# Patient Record
Sex: Male | Born: 1955 | Race: White | Hispanic: No | Marital: Married | Smoking: Never smoker
Health system: Southern US, Community
[De-identification: ages and names within clinical notes are randomized; demographics above are authoritative.]

## PROBLEM LIST (undated history)

## (undated) DIAGNOSIS — E7800 Pure hypercholesterolemia, unspecified: Secondary | ICD-10-CM

## (undated) DIAGNOSIS — R0789 Other chest pain: Secondary | ICD-10-CM

## (undated) DIAGNOSIS — M545 Low back pain, unspecified: Secondary | ICD-10-CM

## (undated) DIAGNOSIS — K4090 Unilateral inguinal hernia, without obstruction or gangrene, not specified as recurrent: Secondary | ICD-10-CM

## (undated) DIAGNOSIS — R7301 Impaired fasting glucose: Secondary | ICD-10-CM

## (undated) DIAGNOSIS — N200 Calculus of kidney: Secondary | ICD-10-CM

## (undated) DIAGNOSIS — Z8669 Personal history of other diseases of the nervous system and sense organs: Secondary | ICD-10-CM

## (undated) DIAGNOSIS — U071 COVID-19: Secondary | ICD-10-CM

## (undated) DIAGNOSIS — Z1211 Encounter for screening for malignant neoplasm of colon: Secondary | ICD-10-CM

## (undated) DIAGNOSIS — N486 Induration penis plastica: Secondary | ICD-10-CM

## (undated) DIAGNOSIS — I10 Essential (primary) hypertension: Secondary | ICD-10-CM

## (undated) HISTORY — DX: Pure hypercholesterolemia, unspecified: E78.00

## (undated) HISTORY — DX: Encounter for screening for malignant neoplasm of colon: Z12.11

## (undated) HISTORY — DX: Other chest pain: R07.89

## (undated) HISTORY — DX: Essential (primary) hypertension: I10

## (undated) HISTORY — PX: INGUINAL HERNIA REPAIR: SHX194

## (undated) HISTORY — DX: Impaired fasting glucose: R73.01

## (undated) HISTORY — DX: Calculus of kidney: N20.0

## (undated) HISTORY — DX: Unilateral inguinal hernia, without obstruction or gangrene, not specified as recurrent: K40.90

## (undated) HISTORY — DX: Personal history of other diseases of the nervous system and sense organs: Z86.69

## (undated) HISTORY — DX: COVID-19: U07.1

## (undated) HISTORY — DX: Low back pain, unspecified: M54.50

## (undated) HISTORY — DX: Low back pain: M54.5

## (undated) HISTORY — DX: Induration penis plastica: N48.6

## (undated) HISTORY — PX: LITHOTRIPSY: SUR834

---

## 1992-05-03 HISTORY — PX: LUMBAR DISC SURGERY: SHX700

## 2000-08-27 ENCOUNTER — Emergency Department (HOSPITAL_COMMUNITY): Admission: EM | Admit: 2000-08-27 | Discharge: 2000-08-27 | Payer: Self-pay | Admitting: Emergency Medicine

## 2007-11-20 ENCOUNTER — Ambulatory Visit (HOSPITAL_COMMUNITY): Admission: RE | Admit: 2007-11-20 | Discharge: 2007-11-20 | Payer: Self-pay | Admitting: Urology

## 2011-05-04 DIAGNOSIS — N486 Induration penis plastica: Secondary | ICD-10-CM

## 2011-05-04 HISTORY — DX: Induration penis plastica: N48.6

## 2011-09-28 ENCOUNTER — Encounter: Payer: Self-pay | Admitting: Family Medicine

## 2011-09-28 ENCOUNTER — Ambulatory Visit (INDEPENDENT_AMBULATORY_CARE_PROVIDER_SITE_OTHER): Payer: 59 | Admitting: Family Medicine

## 2011-09-28 VITALS — BP 126/83 | HR 66 | Temp 98.8°F | Wt 208.0 lb

## 2011-09-28 DIAGNOSIS — J019 Acute sinusitis, unspecified: Secondary | ICD-10-CM

## 2011-09-28 DIAGNOSIS — H6690 Otitis media, unspecified, unspecified ear: Secondary | ICD-10-CM

## 2011-09-28 DIAGNOSIS — I10 Essential (primary) hypertension: Secondary | ICD-10-CM

## 2011-09-28 MED ORDER — BENZONATATE 200 MG PO CAPS: 200.0000 mg | ORAL_CAPSULE | Freq: Two times a day (BID) | ORAL | Status: AC | PRN

## 2011-09-28 MED ORDER — AMOXICILLIN-POT CLAVULANATE 875-125 MG PO TABS: 1.0000 | ORAL_TABLET | Freq: Two times a day (BID) | ORAL | Status: AC

## 2011-09-28 NOTE — Assessment & Plan Note (Signed)
With acute sinisitis. Augmentin 875mg  bid x 10d. Tessalon perles 200mg  q8h prn.

## 2011-09-28 NOTE — Assessment & Plan Note (Signed)
Reports hx of good control on current med although monitoring has been sporadic at best and he mainly is basing this on how he feels. Continue current meds, obtain old records, needs routine HTN f/u q75mo.

## 2011-09-28 NOTE — Progress Notes (Signed)
Office Note 09/28/2011  CC:  Chief Complaint  Patient presents with  . Establish Care    cough, congestion, ear pressure x 1 week   Here to estab care. Former MD: Dr. Milford Cage in [place]. Switched here b/c wife comes here.  Pt not very communicative at all today and basically wanted to discuss present illness and not dwell on much further.    HPI:  Christopher Hunter is a 56 y.o. White male who is here to establish and discuss recent URI. Patient's most recent primary MD: Pt presents complaining of respiratory symptoms for 8+ days.  Primary symptoms are: nasal congestion, worsening facial/head fullness/PND, throat feeling that makes him want to cough, R>L ear ache.  ST initially but this part is better.  Worst symptoms seems to be the head pressure and cough.  Lately the symptoms seem to be worsening.  No fever. Pertinent negatives: No fevers, no wheezing, and no SOB.  No pain in face or teeth.  Symptoms made worse by night-time/sleeping.  Symptoms improved by nothing. Smoker? no Recent sick contact? unknown Muscle or joint aches? Run down/fatigue slightly but no achiness.   Additional ROS: no n/v/d or abdominal pain.  No rash.  No neck stiffness.   +Mild fatigue.  +Mild appetite loss.  Past Medical History  Diagnosis Date  . HTN (hypertension) dx'd about 2008  . Nephrolithiasis     No active dz. (Dr. Vernie Ammons)    Past Surgical History  Procedure Date  . Lithotripsy around 2009  . Lumbar disc surgery 1994    L5/S1 Dr. Lonny Prude  . Inguinal hernia repair     right   FH: pt not interested in discussing this today; he was preoccupied with his current illness and wanted to talk about it and so we'll save FH collection for next visit and get some through old records.  History   Social History  . Marital Status: Married    Spouse Name: N/A    Number of Children: N/A  . Years of Education: N/A   Occupational History  . Not on file.   Social History Main Topics  . Smoking  status: Never Smoker   . Smokeless tobacco: Never Used  . Alcohol Use: No  . Drug Use: No  . Sexually Active: Not on file   Other Topics Concern  . Not on file   Social History Narrative   Married, one 62 yr old son.Lives in [place], [place].  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    Outpatient Encounter Prescriptions as of 09/28/2011  Medication Sig Dispense Refill  . amoxicillin-clavulanate (AUGMENTIN) 875-125 MG per tablet Take 1 tablet by mouth 2 (two) times daily.  20 tablet  0  . benzonatate (TESSALON) 200 MG capsule Take 1 capsule (200 mg total) by mouth 2 (two) times daily as needed for cough.  20 capsule  1  . enalapril-hydrochlorothiazide (VASERETIC) 10-25 MG per tablet Take 1 tablet by mouth daily.      Marland Kitchen ibuprofen (ADVIL,MOTRIN) 800 MG tablet Take 800 mg by mouth every 6 (six) hours as needed.      . Multiple Vitamin (MULTIVITAMIN) tablet Take 1 tablet by mouth daily.        No Known Allergies  ROS Review of Systems  Constitutional: Negative for fever and fatigue.  HENT:       See HPI   Eyes: Negative for visual disturbance.  Respiratory: Negative for chest tightness and wheezing.   Cardiovascular: Negative  for chest pain and palpitations.  Gastrointestinal: Negative for nausea and abdominal pain.  Genitourinary: Negative for dysuria.  Musculoskeletal: Negative for back pain and joint swelling.  Skin: Negative for rash.  Neurological: Negative for weakness and headaches.  Hematological: Negative for adenopathy.    PE; Blood pressure 126/83, pulse 66, temperature 98.8 F (37.1 C), temperature source Temporal, weight 208 lb (94.348 kg), SpO2 99.00%. Gen: Alert, well appearing.  Patient is oriented to person, place, time, and situation. ENT: Ears: EACs clear, normal epithelium.  Left TM normal.  Right Tm with mild erythema and pus noted in middle ear.  Eyes: no injection, icteris, swelling, or exudate.  EOMI,  PERRLA. Nose: clear drainage and turbinate edema/swelling.  No injection or focal lesion.  Mouth: lips without lesion/swelling.  No TTP in paranasal sinus regions.  Oral mucosa pink and moist.  Dentition intact and without obvious caries or gingival swelling.  Oropharynx without erythema, exudate, or swelling.  Neck - No masses or thyromegaly or limitation in range of motion CV: RRR, no m/r/g.   LUNGS: CTA bilat, nonlabored resps, good aeration in all lung fields. JXB:JYNW, NT/ND EXT: no clubbing, cyanosis, or edema.   Pertinent labs:  None today  ASSESSMENT AND PLAN:   NEW PT: obtain old records.  Otitis media With acute sinisitis. Augmentin 875mg  bid x 10d. Tessalon perles 200mg  q8h prn.   HTN (hypertension), benign Reports hx of good control on current med although monitoring has been sporadic at best and he mainly is basing this on how he feels. Continue current meds, obtain old records, needs routine HTN f/u q19mo.     Return in about 6 months (around 03/30/2012) for routine HTN follow up.

## 2011-09-29 ENCOUNTER — Encounter: Payer: Self-pay | Admitting: Family Medicine

## 2011-09-29 DIAGNOSIS — Z1211 Encounter for screening for malignant neoplasm of colon: Secondary | ICD-10-CM | POA: Insufficient documentation

## 2012-02-15 ENCOUNTER — Other Ambulatory Visit: Payer: Self-pay | Admitting: *Deleted

## 2012-02-15 MED ORDER — ENALAPRIL-HYDROCHLOROTHIAZIDE 10-25 MG PO TABS: 1.0000 | ORAL_TABLET | Freq: Every day | ORAL | Status: DC

## 2012-02-15 NOTE — Telephone Encounter (Signed)
Faxed refill request received from pharmacy for ENALAPRIL-HCTZ Last filled by MD on never filled by our office Last seen on 09/28/11 Follow up 03/31/12 RX sent.

## 2012-02-16 ENCOUNTER — Other Ambulatory Visit: Payer: Self-pay | Admitting: *Deleted

## 2012-02-16 MED ORDER — IBUPROFEN 800 MG PO TABS: 800.0000 mg | ORAL_TABLET | Freq: Four times a day (QID) | ORAL | Status: DC | PRN

## 2012-02-16 NOTE — Telephone Encounter (Signed)
Faxed refill request received from pharmacy for Never filled by our office Last seen on 09/28/11  Follow up 03/31/12 Please advise refill.

## 2012-02-16 NOTE — Telephone Encounter (Signed)
RF done. Keep f/u appt in November.-thx

## 2012-03-31 ENCOUNTER — Encounter: Payer: Self-pay | Admitting: Family Medicine

## 2012-03-31 ENCOUNTER — Ambulatory Visit (INDEPENDENT_AMBULATORY_CARE_PROVIDER_SITE_OTHER): Payer: 59 | Admitting: Family Medicine

## 2012-03-31 VITALS — BP 121/78 | HR 91 | Ht 70.0 in | Wt 211.0 lb

## 2012-03-31 DIAGNOSIS — J069 Acute upper respiratory infection, unspecified: Secondary | ICD-10-CM

## 2012-03-31 DIAGNOSIS — I10 Essential (primary) hypertension: Secondary | ICD-10-CM

## 2012-03-31 DIAGNOSIS — Z1211 Encounter for screening for malignant neoplasm of colon: Secondary | ICD-10-CM

## 2012-03-31 LAB — BASIC METABOLIC PANEL
BUN: 17 mg/dL (ref 6–23)
GFR: 75.44 mL/min (ref >60.00)
Potassium: 3.2 mEq/L — ABNORMAL LOW (ref 3.5–5.1)
Sodium: 141 mEq/L (ref 135–145)

## 2012-03-31 NOTE — Progress Notes (Signed)
OFFICE NOTE  03/31/2012  CC:  Chief Complaint  Patient presents with  . Follow-up    HTN, also states he has a cold     HPI: Patient is a 56 y.o. Caucasian male who is here for 6 month f/u HTN. Was recently out of work 3 weeks due to a neck strain and this has resolved.   He does not monitor his bp at home.  ROS for this problem: no HAs, no vision complaints, no dizziness, no CP, no SOB, no extremity edema. Has had 3d of runny nose, scratchy throat, cough.  No fever.  No body aches.  Nyquil cold/flu and a cough drop helped last night.  Takes generic zyrtec once daily.  NO Chest tightness or wheezing.  Took a mucinex DM each night.     Pertinent PMH:  Past Medical History  Diagnosis Date  . HTN (hypertension) dx'd about 2008  . Nephrolithiasis     No active dz. (Dr. Vernie Ammons)  . 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 History of migraine headaches     MEDS:  Outpatient Prescriptions Prior to Visit  Medication Sig Dispense Refill  . enalapril-hydrochlorothiazide (VASERETIC) 10-25 MG per tablet Take 1 tablet by mouth daily.  30 tablet  5  . ibuprofen (ADVIL,MOTRIN) 800 MG tablet Take 1 tablet (800 mg total) by mouth every 6 (six) hours as needed.  30 tablet  1  . Multiple Vitamin (MULTIVITAMIN) tablet Take 1 tablet by mouth daily.        PE: Blood pressure 121/78, pulse 91, height 5\' 10"  (1.778 m), weight 211 lb (95.709 kg). VS: noted--normal. Gen: alert, NAD, well-APPEARING. HEENT: eyes without injection, drainage, or swelling.  Ears: EACs clear, TMs with normal light reflex and landmarks.  Nose: Clear rhinorrhea, with some dried, crusty exudate adherent to mildly injected mucosa.  No purulent d/c.  No paranasal sinus TTP.  No facial swelling.  Throat and mouth without focal lesion.  No pharyngial swelling, erythema, or exudate.   Neck: supple, no LAD.   LUNGS: CTA bilat, nonlabored resps.   CV: RRR, no m/r/g. EXT: no c/c/e SKIN: no rash  LAB:  none  IMPRESSION AND PLAN:  HTN (hypertension), benign Problem stable.  Continue current medications and diet appropriate for this condition.  We have reviewed our general long term plan for this problem and also reviewed symptoms and signs that should prompt the patient to call or return to the office. Check cr/lytes today.  Viral URI Self-limited nature of this illness was discussed, questions answered.  Discussed symptomatic care, rest, fluids.   Warning signs/symptoms of worsening illness were discussed.  Patient instructed to call or return if any of these occur.   Colon cancer screening Per old records, pt has repeatedly refused referral for this screening procedure.  At one point he agreed to finally get referral but so far he has kept putting the appt off/cancelling.  He said he would agree next year to a new referral to [facility] when I see him in April for his annual CPE. Of note, he said he did not care to have any of the routine blood screening that we usually get at adult annual CPE's and I said this was fine.   An After Visit Summary was printed and given to the patient.  FOLLOW UP: 37mo--CPE

## 2012-03-31 NOTE — Assessment & Plan Note (Signed)
Per old records, pt has repeatedly refused referral for this screening procedure.  At one point he agreed to finally get referral but so far he has kept putting the appt off/cancelling.  He said he would agree next year to a new referral to [facility] when I see him in April for his annual CPE. Of note, he said he did not care to have any of the routine blood screening that we usually get at adult annual CPE's and I said this was fine.

## 2012-03-31 NOTE — Assessment & Plan Note (Signed)
Problem stable.  Continue current medications and diet appropriate for this condition.  We have reviewed our general long term plan for this problem and also reviewed symptoms and signs that should prompt the patient to call or return to the office. Check cr/lytes today. 

## 2012-03-31 NOTE — Assessment & Plan Note (Signed)
Self-limited nature of this illness was discussed, questions answered.  Discussed symptomatic care, rest, fluids.   Warning signs/symptoms of worsening illness were discussed.  Patient instructed to call or return if any of these occur.  

## 2012-04-21 ENCOUNTER — Other Ambulatory Visit: Payer: Self-pay | Admitting: Family Medicine

## 2012-04-21 MED ORDER — POTASSIUM CHLORIDE CRYS ER 20 MEQ PO TBCR: 40.0000 meq | EXTENDED_RELEASE_TABLET | Freq: Every day | ORAL | Status: DC

## 2012-04-21 NOTE — Telephone Encounter (Signed)
Notes Recorded by Jeoffrey Massed, MD on 03/31/2012 at 4:06 PM Pls notify pt that his potassium was a bit low today and I recommend he take some rx potassium supplement as long as he is taking his current bp med. Please do eRx for KDUR 40 mEQ, 1 tab po qd, #30, RF x 6.--thx  RX was not sent at that time.  RX sent today.

## 2012-08-04 ENCOUNTER — Other Ambulatory Visit: Payer: Self-pay | Admitting: *Deleted

## 2012-08-04 MED ORDER — ENALAPRIL-HYDROCHLOROTHIAZIDE 10-25 MG PO TABS: 1.0000 | ORAL_TABLET | Freq: Every day | ORAL | Status: DC

## 2012-08-04 NOTE — Telephone Encounter (Signed)
Faxed refill request received from pharmacy for VASERETIC Last filled by MD on 02/15/12, #30 X 5 Last seen on 03/31/12 Follow up 6 MONTHS RX SENT.

## 2012-08-15 ENCOUNTER — Telehealth: Payer: Self-pay | Admitting: *Deleted

## 2012-08-15 MED ORDER — IBUPROFEN 800 MG PO TABS: 800.0000 mg | ORAL_TABLET | Freq: Four times a day (QID) | ORAL | Status: DC | PRN

## 2012-08-15 NOTE — Telephone Encounter (Signed)
Rx sent in to pharmacy. 

## 2012-08-31 ENCOUNTER — Encounter: Payer: Self-pay | Admitting: Family Medicine

## 2012-08-31 ENCOUNTER — Ambulatory Visit (INDEPENDENT_AMBULATORY_CARE_PROVIDER_SITE_OTHER): Payer: BC Managed Care – PPO | Admitting: Family Medicine

## 2012-08-31 VITALS — BP 137/87 | HR 66 | Temp 98.1°F | Resp 16 | Ht 69.5 in | Wt 209.0 lb

## 2012-08-31 DIAGNOSIS — F17200 Nicotine dependence, unspecified, uncomplicated: Secondary | ICD-10-CM

## 2012-08-31 DIAGNOSIS — Z720 Tobacco use: Secondary | ICD-10-CM | POA: Insufficient documentation

## 2012-08-31 DIAGNOSIS — Z1211 Encounter for screening for malignant neoplasm of colon: Secondary | ICD-10-CM

## 2012-08-31 DIAGNOSIS — Z0000 Encounter for general adult medical examination without abnormal findings: Secondary | ICD-10-CM | POA: Insufficient documentation

## 2012-08-31 DIAGNOSIS — Z125 Encounter for screening for malignant neoplasm of prostate: Secondary | ICD-10-CM

## 2012-08-31 LAB — CBC WITH DIFFERENTIAL/PLATELET
Eosinophils Relative: 1.2 % (ref 0.0–5.0)
HCT: 44.7 % (ref 39.0–52.0)
Lymphs Abs: 1.3 10*3/uL (ref 0.7–4.0)
Monocytes Relative: 9 % (ref 3.0–12.0)
Platelets: 140 10*3/uL — ABNORMAL LOW (ref 150.0–400.0)
WBC: 5.4 10*3/uL (ref 4.5–10.5)

## 2012-08-31 LAB — TSH: TSH: 0.77 u[IU]/mL (ref 0.35–5.50)

## 2012-08-31 LAB — LIPID PANEL
Cholesterol: 151 mg/dL (ref 0–200)
HDL: 42.5 mg/dL (ref >39.00)
VLDL: 19.2 mg/dL (ref 0.0–40.0)

## 2012-08-31 LAB — COMPREHENSIVE METABOLIC PANEL
Alkaline Phosphatase: 48 U/L (ref 39–117)
BUN: 17 mg/dL (ref 6–23)
Creatinine, Ser: 1.1 mg/dL (ref 0.4–1.5)
Glucose, Bld: 90 mg/dL (ref 70–99)
Sodium: 136 mEq/L (ref 135–145)
Total Bilirubin: 0.7 mg/dL (ref 0.3–1.2)

## 2012-08-31 LAB — PSA: PSA: 0.94 ng/mL (ref 0.10–4.00)

## 2012-08-31 MED ORDER — NICOTINE 14 MG/24HR TD PT24: 1.0000 | MEDICATED_PATCH | TRANSDERMAL | Status: DC

## 2012-08-31 MED ORDER — NICOTINE 7 MG/24HR TD PT24: 1.0000 | MEDICATED_PATCH | TRANSDERMAL | Status: DC

## 2012-08-31 MED ORDER — NICOTINE POLACRILEX 2 MG MT GUM: 2.0000 mg | CHEWING_GUM | OROMUCOSAL | Status: DC | PRN

## 2012-08-31 NOTE — Patient Instructions (Signed)
Health Maintenance, Males A healthy lifestyle and preventative care can promote health and wellness.  Maintain regular health, dental, and eye exams.  Eat a healthy diet. Foods like vegetables, fruits, whole grains, low-fat dairy products, and lean protein foods contain the nutrients you need without too many calories. Decrease your intake of foods high in solid fats, added sugars, and salt. Get information about a proper diet from your caregiver, if necessary.  Regular physical exercise is one of the most important things you can do for your health. Most adults should get at least 150 minutes of moderate-intensity exercise (any activity that increases your heart rate and causes you to sweat) each week. In addition, most adults need muscle-strengthening exercises on 2 or more days a week.   Maintain a healthy weight. The body mass index (BMI) is a screening tool to identify possible weight problems. It provides an estimate of body fat based on height and weight. Your caregiver can help determine your BMI, and can help you achieve or maintain a healthy weight. For adults 20 years and older:  A BMI below 18.5 is considered underweight.  A BMI of 18.5 to 24.9 is normal.  A BMI of 25 to 29.9 is considered overweight.  A BMI of 30 and above is considered obese.  Maintain normal blood lipids and cholesterol by exercising and minimizing your intake of saturated fat. Eat a balanced diet with plenty of fruits and vegetables. Blood tests for lipids and cholesterol should begin at age 20 and be repeated every 5 years. If your lipid or cholesterol levels are high, you are over 50, or you are a high risk for heart disease, you may need your cholesterol levels checked more frequently.Ongoing high lipid and cholesterol levels should be treated with medicines, if diet and exercise are not effective.  If you smoke, find out from your caregiver how to quit. If you do not use tobacco, do not start.  If you  choose to drink alcohol, do not exceed 2 drinks per day. One drink is considered to be 12 ounces (355 mL) of beer, 5 ounces (148 mL) of wine, or 1.5 ounces (44 mL) of liquor.  Avoid use of street drugs. Do not share needles with anyone. Ask for help if you need support or instructions about stopping the use of drugs.  High blood pressure causes heart disease and increases the risk of stroke. Blood pressure should be checked at least every 1 to 2 years. Ongoing high blood pressure should be treated with medicines if weight loss and exercise are not effective.  If you are 45 to 56 years old, ask your caregiver if you should take aspirin to prevent heart disease.  Diabetes screening involves taking a blood sample to check your fasting blood sugar level. This should be done once every 3 years, after age 45, if you are within normal weight and without risk factors for diabetes. Testing should be considered at a younger age or be carried out more frequently if you are overweight and have at least 1 risk factor for diabetes.  Colorectal cancer can be detected and often prevented. Most routine colorectal cancer screening begins at the age of 50 and continues through age 75. However, your caregiver may recommend screening at an earlier age if you have risk factors for colon cancer. On a yearly basis, your caregiver may provide home test kits to check for hidden blood in the stool. Use of a small camera at the end of a tube,   to directly examine the colon (sigmoidoscopy or colonoscopy), can detect the earliest forms of colorectal cancer. Talk to your caregiver about this at age 50, when routine screening begins. Direct examination of the colon should be repeated every 5 to 10 years through age 75, unless early forms of pre-cancerous polyps or small growths are found.  Hepatitis C blood testing is recommended for all people born from 1945 through 1965 and any individual with known risks for hepatitis C.  Healthy  men should no longer receive prostate-specific antigen (PSA) blood tests as part of routine cancer screening. Consult with your caregiver about prostate cancer screening.  Testicular cancer screening is not recommended for adolescents or adult males who have no symptoms. Screening includes self-exam, caregiver exam, and other screening tests. Consult with your caregiver about any symptoms you have or any concerns you have about testicular cancer.  Practice safe sex. Use condoms and avoid high-risk sexual practices to reduce the spread of sexually transmitted infections (STIs).  Use sunscreen with a sun protection factor (SPF) of 30 or greater. Apply sunscreen liberally and repeatedly throughout the day. You should seek shade when your shadow is shorter than you. Protect yourself by wearing long sleeves, pants, a wide-brimmed hat, and sunglasses year round, whenever you are outdoors.  Notify your caregiver of new moles or changes in moles, especially if there is a change in shape or color. Also notify your caregiver if a mole is larger than the size of a pencil eraser.  A one-time screening for abdominal aortic aneurysm (AAA) and surgical repair of large AAAs by sound wave imaging (ultrasonography) is recommended for ages 65 to 75 years who are current or former smokers.  Stay current with your immunizations. Document Released: 10/16/2007 Document Revised: 07/12/2011 Document Reviewed: 09/14/2010 Exit[employer] Patient Information 2013 [employer].  

## 2012-08-31 NOTE — Progress Notes (Addendum)
Office Note 08/31/2012  CC:  Chief Complaint  Patient presents with  . Annual Exam    Pt is fasting for labs.    HPI:  Christopher Hunter is a 57 y.o. White male who is here for CPE/health maintenance exam.   It turns out there was a misunderstanding last visit, and he does want to get the usual annual screening blood work with his physical, including PSA. He also has a note from his employer citing incentives at work if he quits using smokeless tobacco and he wants to quit this now. The form says that his insurer will cover cost of rx nicotine patches and gum.  He pretty much has a dip in all day except when eating.   He does not smoke cigarettes or cigars.   Past Medical History  Diagnosis Date  . HTN (hypertension) dx'd about 2008  . Nephrolithiasis     No active dz. (Dr. Vernie Ammons)  . 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 History of migraine headaches     Past Surgical History  Procedure Laterality Date  . Lithotripsy  around 2009  . Lumbar disc surgery  1994    L5/S1 Dr. Lonny Prude  . Inguinal hernia repair      right    No family history on file.  History   Social History  . Marital Status: Married    Spouse Name: N/A    Number of Children: N/A  . Years of Education: N/A   Occupational History  . Not on file.   Social History Main Topics  . Smoking status: Never Smoker   . Smokeless tobacco: Current User    Types: Snuff  . Alcohol Use: No  . Drug Use: No  . Sexually Active: Not on file   Other Topics Concern  . Not on file   Social History Narrative   Married, one 44 yr old son.   Lives in [place], [place].  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   Never smoker, no ETOH, no drugs.          Outpatient Prescriptions Prior to Visit  Medication Sig Dispense Refill  . enalapril-hydrochlorothiazide (VASERETIC) 10-25 MG per tablet Take 1 tablet by mouth daily.  30 tablet  1  . ibuprofen (ADVIL,MOTRIN) 800 MG tablet Take 1  tablet (800 mg total) by mouth every 6 (six) hours as needed.  30 tablet  0  . Multiple Vitamin (MULTIVITAMIN) tablet Take 1 tablet by mouth daily.      . potassium chloride SA (K-DUR,KLOR-CON) 20 MEQ tablet Take 2 tablets (40 mEq total) by mouth daily.  60 tablet  6   No facility-administered medications prior to visit.    No Known Allergies  ROS Review of Systems  Constitutional: Negative for fever, chills, appetite change and fatigue.  HENT: Negative for ear pain, congestion, sore throat, neck stiffness and dental problem.   Eyes: Negative for discharge, redness and visual disturbance.  Respiratory: Negative for cough, chest tightness, shortness of breath and wheezing.   Cardiovascular: Negative for chest pain, palpitations and leg swelling.  Gastrointestinal: Negative for nausea, vomiting, abdominal pain, diarrhea and blood in stool.  Genitourinary: Negative for dysuria, urgency, frequency, hematuria, flank pain and difficulty urinating.  Musculoskeletal: Negative for myalgias, back pain, joint swelling and arthralgias.  Skin: Negative for pallor and rash.  Neurological: Negative for dizziness, speech difficulty, weakness and headaches.  Hematological: Negative for adenopathy. Does not bruise/bleed easily.  Psychiatric/Behavioral: Negative  for confusion and sleep disturbance. The patient is not nervous/anxious.     PE; Blood pressure 137/87, pulse 66, temperature 98.1 F (36.7 C), temperature source Oral, resp. rate 16, height 5' 9.5" (1.765 m), weight 209 lb (94.802 kg), SpO2 99.00%. Gen: Alert, well appearing.  Patient is oriented to person, place, time, and situation. AFFECT: pleasant, lucid thought and speech. ENT: Ears: EACs clear, normal epithelium.  TMs with good light reflex and landmarks bilaterally.  Eyes: no injection, icteris, swelling, or exudate.  EOMI, PERRLA. Nose: no drainage or turbinate edema/swelling.  No injection or focal lesion.  Mouth: lips without  lesion/swelling.  Oral mucosa pink and moist.  Dentition intact and without obvious caries or gingival swelling.  Oropharynx without erythema, exudate, or swelling.  Neck: supple/nontender.  No LAD, mass, or TM.  Carotid pulses 2+ bilaterally, without bruits. CV: RRR, no m/r/g.   LUNGS: CTA bilat, nonlabored resps, good aeration in all lung fields. ABD: soft, NT, ND, BS normal.  No hepatospenomegaly or mass.  No bruits. EXT: no clubbing, cyanosis, or edema.  Musculoskeletal: no joint swelling, erythema, warmth, or tenderness.  ROM of all joints intact. Skin - no sores or suspicious lesions or rashes or color changes Genitals normal; both testes normal without tenderness, masses, hydroceles, varicoceles, erythema or swelling. Shaft normal, circumcised, meatus normal without discharge. No inguinal hernia noted. No inguinal lymphadenopathy. Rectal exam: negative without mass, lesions or tenderness, PROSTATE EXAM: smooth and symmetric without nodules or tenderness.   Pertinent labs:  None today  ASSESSMENT AND PLAN:   Health maintenance examination Reviewed age and gender appropriate health maintenance issues (prudent diet, regular exercise, health risks of tobacco and excessive alcohol, use of seatbelts, fire alarms in home, use of sunscreen).  Also reviewed age and gender appropriate health screening as well as vaccine recommendations. Routine health panel labs + PSA done today.  DRE normal today. Referred to [facility] for colon cancer screening today.  Smokeless tobacco use He is ready to attempt quitting but wants nicotine replacement rx's so I wrote rx's for nicoderm 14mg /day patches x 26mo, then 7 mg/day patches x 26mo. Also wrote rx for nicorette gum 2 mg, take prn cravings.  An After Visit Summary was printed and given to the patient.  FOLLOW UP:  Return in about 6 months (around 03/03/2013) for f/u HTN.

## 2012-08-31 NOTE — Assessment & Plan Note (Signed)
He is ready to attempt quitting but wants nicotine replacement rx's so I wrote rx's for nicoderm 14mg /day patches x 16mo, then 7 mg/day patches x 16mo. Also wrote rx for nicorette gum 2 mg, take prn cravings.

## 2012-08-31 NOTE — Assessment & Plan Note (Signed)
Reviewed age and gender appropriate health maintenance issues (prudent diet, regular exercise, health risks of tobacco and excessive alcohol, use of seatbelts, fire alarms in home, use of sunscreen).  Also reviewed age and gender appropriate health screening as well as vaccine recommendations. Routine health panel labs + PSA done today.  DRE normal today. Referred to [facility] for colon cancer screening today.

## 2012-09-01 ENCOUNTER — Encounter: Payer: Self-pay | Admitting: *Deleted

## 2012-09-28 ENCOUNTER — Telehealth: Payer: Self-pay | Admitting: Family Medicine

## 2012-09-28 MED ORDER — NICOTINE 14 MG/24HR TD PT24: 1.0000 | MEDICATED_PATCH | TRANSDERMAL | Status: DC

## 2012-09-28 NOTE — Telephone Encounter (Signed)
Patient would like one more refill of the 14 mg. He said quitting smoking has been harder than he thought it would be.

## 2012-09-28 NOTE — Telephone Encounter (Signed)
Rx sent 

## 2012-10-18 ENCOUNTER — Other Ambulatory Visit: Payer: Self-pay | Admitting: *Deleted

## 2012-10-18 MED ORDER — ENALAPRIL-HYDROCHLOROTHIAZIDE 10-25 MG PO TABS: 1.0000 | ORAL_TABLET | Freq: Every day | ORAL | Status: DC

## 2012-10-18 NOTE — Telephone Encounter (Signed)
Rx request to pharmacy/[facility]  

## 2012-10-20 ENCOUNTER — Other Ambulatory Visit: Payer: Self-pay | Admitting: Family Medicine

## 2012-10-20 MED ORDER — IBUPROFEN 800 MG PO TABS: 800.0000 mg | ORAL_TABLET | Freq: Four times a day (QID) | ORAL | Status: DC | PRN

## 2012-10-20 NOTE — Telephone Encounter (Signed)
Patient requesting ibuprofen 800mg .  Patient was last seen 08/31/12 and has an upcoming appt 03/26/13.  Refilled 30 pills no refill.

## 2012-11-01 ENCOUNTER — Encounter: Payer: Self-pay | Admitting: Gastroenterology

## 2012-11-08 ENCOUNTER — Encounter: Payer: Self-pay | Admitting: Family Medicine

## 2012-12-12 ENCOUNTER — Encounter: Payer: BC Managed Care – PPO | Admitting: Gastroenterology

## 2012-12-19 ENCOUNTER — Other Ambulatory Visit: Payer: Self-pay | Admitting: *Deleted

## 2012-12-19 NOTE — Telephone Encounter (Signed)
Refill request for ibuprofen Last filled by MD on - 10/20/12 Last Seen- 08/31/12 Next Appt- 03/26/13 Please advise refill?

## 2012-12-21 MED ORDER — IBUPROFEN 800 MG PO TABS: 800.0000 mg | ORAL_TABLET | Freq: Four times a day (QID) | ORAL | Status: DC | PRN

## 2013-01-23 ENCOUNTER — Other Ambulatory Visit: Payer: Self-pay | Admitting: Family Medicine

## 2013-01-23 MED ORDER — ENALAPRIL-HYDROCHLOROTHIAZIDE 10-25 MG PO TABS: 1.0000 | ORAL_TABLET | Freq: Every day | ORAL | Status: DC

## 2013-01-26 ENCOUNTER — Ambulatory Visit (INDEPENDENT_AMBULATORY_CARE_PROVIDER_SITE_OTHER): Payer: BC Managed Care – PPO | Admitting: Family Medicine

## 2013-01-26 ENCOUNTER — Encounter: Payer: Self-pay | Admitting: Family Medicine

## 2013-01-26 VITALS — BP 128/85 | HR 73 | Temp 98.4°F | Resp 18 | Ht 69.5 in | Wt 215.0 lb

## 2013-01-26 DIAGNOSIS — J019 Acute sinusitis, unspecified: Secondary | ICD-10-CM

## 2013-01-26 MED ORDER — FLUTICASONE PROPIONATE 50 MCG/ACT NA SUSP: NASAL | Status: DC

## 2013-01-26 MED ORDER — AMOXICILLIN 875 MG PO TABS: 875.0000 mg | ORAL_TABLET | Freq: Two times a day (BID) | ORAL | Status: DC

## 2013-01-26 NOTE — Progress Notes (Signed)
OFFICE NOTE  01/26/2013  CC:  Chief Complaint  Patient presents with  . Pressure Behind the Eyes    x Wednesday  . Headache     HPI: Patient is a 57 y.o. Caucasian male who is here for "sinus headache".  1 week of nasal congestion, left side got worse 2 d/a, feeling pressure behind eyes bilat throughout the day and then by evening he gets headache.  Nasal congestion is improved but he still feels a bit stopped up and has scratchy throat.  OTC meds: nyquil cold and flu every night (even without a cold) "to help stop my snoring".  Mucinex nose spray. He did run out of BP meds for two days but he didn't think it had anything to do with his so he didn't check his bp any.  He restarted bp med yesterday.  Not feeling any better yesterday.  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   Pertinent PMH:  Past Medical History  Diagnosis Date  . HTN (hypertension) dx'd about 2008  . Nephrolithiasis     No active dz. (Dr. Vernie Ammons)  . 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 History of migraine headaches     MEDS:  Outpatient Prescriptions Prior to Visit  Medication Sig Dispense Refill  . cetirizine (ZYRTEC) 10 MG tablet Take 10 mg by mouth daily.      . enalapril-hydrochlorothiazide (VASERETIC) 10-25 MG per tablet Take 1 tablet by mouth daily.  30 tablet  2  . ibuprofen (ADVIL,MOTRIN) 800 MG tablet Take 1 tablet (800 mg total) by mouth every 6 (six) hours as needed.  30 tablet  2  . Multiple Vitamin (MULTIVITAMIN) tablet Take 1 tablet by mouth daily.      . nicotine polacrilex (NICORETTE) 2 MG gum Take 1 each (2 mg total) by mouth as needed for smoking cessation.  50 tablet  2  . potassium chloride SA (K-DUR,KLOR-CON) 20 MEQ tablet Take 20 mEq by mouth daily.      . nicotine (NICODERM CQ - DOSED IN MG/24 HOURS) 14 mg/24hr patch Place 1 patch onto the skin daily.  28 patch  0  . nicotine (NICODERM CQ) 7 mg/24hr patch Place 1 patch onto the skin daily.  28 patch  0   No  facility-administered medications prior to visit.    PE: Blood pressure 128/85, pulse 73, temperature 98.4 F (36.9 C), temperature source Temporal, resp. rate 18, height 5' 9.5" (1.765 m), weight 215 lb (97.523 kg), SpO2 98.00%. VS: noted--normal. Gen: alert, NAD, NONTOXIC APPEARING. HEENT: eyes without injection, drainage, or swelling.  Ears: EACs clear, TMs with normal light reflex and landmarks.  Nose: Clear rhinorrhea, with some dried, crusty exudate adherent to mildly injected mucosa.  No purulent d/c.  No paranasal sinus TTP but PRESSURE with palpation.  No facial swelling.  Throat and mouth without focal lesion.  No pharyngial swelling, erythema, or exudate but some dingy PND is noted.   Neck: supple, no LAD.   LUNGS: CTA bilat, nonlabored resps.   CV: RRR, no m/r/g. EXT: no c/c/e     IMPRESSION AND PLAN:  Acute sinusitis: Amoxil 875mg  bid x 10d. Take tylenol 1000 mg morning, then again 6-8 hours later if still having a headache. You may still take your nyquil cold and flu at bedtime.  Restart nasal saline spray (OTC) 2 sprays each nostril twice a day to irrigate and clear your nasal passages--this will help relieve some of the head pressure you feel.  An After Visit Summary was printed and given to the patient.  FOLLOW UP: prn

## 2013-01-26 NOTE — Patient Instructions (Addendum)
Take tylenol 1000 mg morning, then again 6-8 hours later if still having a headache. You may still take your nyquil cold and flu at bedtime.  Restart nasal saline spray (OTC) 2 sprays each nostril twice a day to irrigate and clear your nasal passages--this will help relieve some of the head pressure you feel.

## 2013-02-26 ENCOUNTER — Telehealth: Payer: Self-pay | Admitting: Family Medicine

## 2013-02-26 MED ORDER — AMOXICILLIN 875 MG PO TABS: 875.0000 mg | ORAL_TABLET | Freq: Two times a day (BID) | ORAL | Status: DC

## 2013-02-26 NOTE — Telephone Encounter (Signed)
Patient is having the exact same sinus symptoms as he did on his last visit but patient now says it is in his Right side in stead of his left.  Can you rx amoxicillin again for patient to treat.   Patient states that he would have to take a day off of work if he needed an OV.  Please advise.

## 2013-02-26 NOTE — Telephone Encounter (Signed)
Amoxicillin eRx'd to his pharmacy. If not improved in 1 week then he needs to come in for o/v.---thx

## 2013-02-26 NOTE — Telephone Encounter (Signed)
Patient aware.

## 2013-03-26 ENCOUNTER — Ambulatory Visit (INDEPENDENT_AMBULATORY_CARE_PROVIDER_SITE_OTHER): Payer: BC Managed Care – PPO | Admitting: Family Medicine

## 2013-03-26 ENCOUNTER — Encounter: Payer: Self-pay | Admitting: Family Medicine

## 2013-03-26 VITALS — BP 134/87 | HR 73 | Temp 99.4°F | Resp 18 | Ht 69.5 in | Wt 219.0 lb

## 2013-03-26 DIAGNOSIS — F17200 Nicotine dependence, unspecified, uncomplicated: Secondary | ICD-10-CM

## 2013-03-26 DIAGNOSIS — Z720 Tobacco use: Secondary | ICD-10-CM

## 2013-03-26 DIAGNOSIS — I10 Essential (primary) hypertension: Secondary | ICD-10-CM

## 2013-03-26 MED ORDER — ASPIRIN 81 MG PO TABS: 81.0000 mg | ORAL_TABLET | Freq: Every day | ORAL | Status: DC

## 2013-03-26 MED ORDER — POTASSIUM CHLORIDE CRYS ER 20 MEQ PO TBCR: 20.0000 meq | EXTENDED_RELEASE_TABLET | Freq: Every day | ORAL | Status: DC

## 2013-03-26 MED ORDER — ENALAPRIL-HYDROCHLOROTHIAZIDE 10-25 MG PO TABS: 1.0000 | ORAL_TABLET | Freq: Every day | ORAL | Status: DC

## 2013-03-26 NOTE — Patient Instructions (Signed)
Start baby aspirin (81mg) once daily.

## 2013-03-26 NOTE — Assessment & Plan Note (Signed)
Successfully quit! Congrats to pt today.

## 2013-03-26 NOTE — Progress Notes (Signed)
OFFICE NOTE  03/26/2013  CC:  Chief Complaint  Patient presents with  . Follow-up    6 months     HPI: Patient is a 57 y.o. Caucasian male who is here for 6 mo HTN and dipping tob dependence f/u. He has quit dipping tobacco since I last saw him, still chewing some nicerette gum sometimes.   Pertinent PMH:  Past Medical History  Diagnosis Date  . HTN (hypertension) dx'd about 2008  . Nephrolithiasis     No active dz. (Dr. Vernie Ammons)  . 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 History of migraine headaches    Past surgical, social, and family history reviewed and no changes noted since last office visit.  MEDS:  Outpatient Prescriptions Prior to Visit  Medication Sig Dispense Refill  . cetirizine (ZYRTEC) 10 MG tablet Take 10 mg by mouth daily.      . enalapril-hydrochlorothiazide (VASERETIC) 10-25 MG per tablet Take 1 tablet by mouth daily.  30 tablet  2  . fluticasone (FLONASE) 50 MCG/ACT nasal spray 2 sprays each nostril once daily  16 g  1  . ibuprofen (ADVIL,MOTRIN) 800 MG tablet Take 1 tablet (800 mg total) by mouth every 6 (six) hours as needed.  30 tablet  2  . Multiple Vitamin (MULTIVITAMIN) tablet Take 1 tablet by mouth daily.      . nicotine polacrilex (NICORETTE) 2 MG gum Take 1 each (2 mg total) by mouth as needed for smoking cessation.  50 tablet  2  . potassium chloride SA (K-DUR,KLOR-CON) 20 MEQ tablet Take 20 mEq by mouth daily.      Marland Kitchen amoxicillin (AMOXIL) 875 MG tablet Take 1 tablet (875 mg total) by mouth 2 (two) times daily.  20 tablet  0  . nicotine (NICODERM CQ - DOSED IN MG/24 HOURS) 14 mg/24hr patch Place 1 patch onto the skin daily.  28 patch  0  . nicotine (NICODERM CQ) 7 mg/24hr patch Place 1 patch onto the skin daily.  28 patch  0   No facility-administered medications prior to visit.    PE: Blood pressure 134/87, pulse 73, temperature 99.4 F (37.4 C), temperature source Temporal, resp. rate 18, height 5' 9.5" (1.765 m),  weight 219 lb (99.338 kg), SpO2 96.00%. Gen: Alert, well appearing.  Patient is oriented to person, place, time, and situation. Neck: no carotid bruit CV: RRR, no m/r/g.   LUNGS: CTA bilat, nonlabored resps, good aeration in all lung fields. EXT: no clubbing, cyanosis, or edema.    IMPRESSION AND PLAN:  HTN (hypertension), benign Problem stable.  Continue current medications and diet appropriate for this condition.  We have reviewed our general long term plan for this problem and also reviewed symptoms and signs that should prompt the patient to call or return to the office. RFd med today.  Smokeless tobacco use Successfully quit! Congrats to pt today.   Pt declined flu vaccine today.  FOLLOW UP: 44mo for fasting CPE

## 2013-03-26 NOTE — Assessment & Plan Note (Signed)
Problem stable.  Continue current medications and diet appropriate for this condition.  We have reviewed our general long term plan for this problem and also reviewed symptoms and signs that should prompt the patient to call or return to the office. RFd med today.

## 2013-07-01 HISTORY — PX: CARDIOVASCULAR STRESS TEST: SHX262

## 2013-07-19 ENCOUNTER — Encounter: Payer: Self-pay | Admitting: Family Medicine

## 2013-07-19 ENCOUNTER — Telehealth: Payer: Self-pay | Admitting: Cardiology

## 2013-07-19 ENCOUNTER — Ambulatory Visit (INDEPENDENT_AMBULATORY_CARE_PROVIDER_SITE_OTHER): Payer: BC Managed Care – PPO | Admitting: Family Medicine

## 2013-07-19 VITALS — BP 148/87 | HR 72 | Temp 98.0°F | Resp 18 | Ht 69.5 in | Wt 217.0 lb

## 2013-07-19 DIAGNOSIS — R9431 Abnormal electrocardiogram [ECG] [EKG]: Secondary | ICD-10-CM

## 2013-07-19 DIAGNOSIS — R0789 Other chest pain: Secondary | ICD-10-CM

## 2013-07-19 DIAGNOSIS — R079 Chest pain, unspecified: Secondary | ICD-10-CM

## 2013-07-19 NOTE — Patient Instructions (Signed)
If not contacted by cardiology (Dr. Verta EllenVaronovsky's nurse) by early next week then call our office.

## 2013-07-19 NOTE — Telephone Encounter (Signed)
Dr. Milinda CaveMcgowen called and spoke with Dr. Eldridge DaceVaranasi regarding pt. Pt has an ABN EKG and Atypical CP. Per Dr. Eldridge DaceVaranasi pt should be set up for a Exercise Myoview.

## 2013-07-19 NOTE — Telephone Encounter (Addendum)
Please schedule pt for Exercise Myoview and send back to me to call pt with date/time and go over instructions.

## 2013-07-19 NOTE — Progress Notes (Signed)
OFFICE NOTE  07/19/2013  CC:  Chief Complaint  Patient presents with  . Chest Pain    on exertion, a couple weeks  . Fatigue     HPI: Patient is a 58 y.o. Caucasian male who is here for chest pain. Reports recurrent chest pain after exertion; sharp, shock-like twinge w/out pressure, and just for 1-2 seconds, associated with diaphoresis and "fatigue--very drained".  Felt some nausea 20-30 min later, had some lightheaded feeling.  No SOB, no cough, no palpitations, no jaw or arm pain. Has had 2 distinct episodes about 2 weeks apart (once was cutting wood, then yesterday was lots of moving heavy objects). Over the last year he has noted that he has easier fatigue and "give out" with activities that never would have made him have problems before. BP at home wnl as per usual lately. No persistent ingestion of NSAIDs lately. Taking his ASA qd.  Pertinent PMH:  Past Medical History  Diagnosis Date  . HTN (hypertension) dx'd about 2008  . Nephrolithiasis     No active dz. (Dr. Vernie Ammonsttelin)  . 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 History of migraine headaches    Past Surgical History  Procedure Laterality Date  . Lithotripsy  around 2009  . Lumbar disc surgery  1994    L5/S1 Dr. Lonny Prudeoby  . Inguinal hernia repair      right   History   Social History Narrative   Married, one 57 yr old son.   Lives in [place], [place].  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   Never smoker, no ETOH, no drugs.          MEDS:  Outpatient Prescriptions Prior to Visit  Medication Sig Dispense Refill  . aspirin 81 MG tablet Take 1 tablet (81 mg total) by mouth daily.  30 tablet  11  . cetirizine (ZYRTEC) 10 MG tablet Take 10 mg by mouth daily.      . enalapril-hydrochlorothiazide (VASERETIC) 10-25 MG per tablet Take 1 tablet by mouth daily.  30 tablet  11  . fluticasone (FLONASE) 50 MCG/ACT nasal spray 2 sprays each nostril once daily  16 g  1  . ibuprofen  (ADVIL,MOTRIN) 800 MG tablet Take 1 tablet (800 mg total) by mouth every 6 (six) hours as needed.  30 tablet  2  . Multiple Vitamin (MULTIVITAMIN) tablet Take 1 tablet by mouth daily.      . nicotine polacrilex (NICORETTE) 2 MG gum Take 1 each (2 mg total) by mouth as needed for smoking cessation.  50 tablet  2  . potassium chloride SA (K-DUR,KLOR-CON) 20 MEQ tablet Take 1 tablet (20 mEq total) by mouth daily.  30 tablet  11   No facility-administered medications prior to visit.    PE: Blood pressure 148/87, pulse 72, temperature 98 F (36.7 C), temperature source Temporal, resp. rate 18, height 5' 9.5" (1.765 m), weight 217 lb (98.431 kg), SpO2 98.00%. Gen: Alert, well appearing.  Patient is oriented to person, place, time, and situation. MVH:QIONENT:Eyes: no injection, icteris, swelling, or exudate.  EOMI, PERRLA. Mouth: lips without lesion/swelling.  Oral mucosa pink and moist. Oropharynx without erythema, exudate, or swelling.  Neck - No masses or thyromegaly or limitation in range of motion CV: RRR, no m/r/g.   LUNGS: CTA bilat, nonlabored resps, good aeration in all lung fields. ABD: soft, NT EXT: no clubbing, cyanosis, or edema.    12 lead EKG today: (no prior avail for comparison)--NSR,  rate 72, possible anterior fascicular block, inverted T wave and low voltage in V1.  IMPRESSION AND PLAN:  Atypical chest pain Overall, his presentation is concerning for angina. No sign of ACS. Reviewed pt's case + EKG today with cardiologist, Dr. Raelene Bott, by phone. He recommended nonurgent stress testing and said his nurse would contact the patient to set this up. I wrote a work note for the patient today to stay out of work starting today, may return after stress test complete and if no further testing/intervention needed to r/o CAD. He said there is no "light duty" option with his employer.  To be determined based on results of pending workup.   An After Visit Summary was printed and given  to the patient.  Signs/symptoms to call or return for were reviewed and pt expressed understanding.

## 2013-07-19 NOTE — Telephone Encounter (Signed)
Myoview scheduled for tomorrow at [facility]. Pt aware and instructions given.

## 2013-07-19 NOTE — Assessment & Plan Note (Addendum)
Overall, his presentation is concerning for angina. No sign of ACS. Reviewed pt's case + EKG today with cardiologist, Dr. Shari ProwsVaronasi, by phone. He recommended nonurgent stress testing and said his nurse would contact the patient to set this up. I wrote a work note for the patient today to stay out of work starting today, may return after stress test complete and if no further testing/intervention needed to r/o CAD. He said there is no "light duty" option with his employer.  To be determined based on results of pending workup.

## 2013-07-19 NOTE — Progress Notes (Signed)
Pre visit review using our clinic review tool, if applicable. No additional management support is needed unless otherwise documented below in the visit note. 

## 2013-07-20 ENCOUNTER — Ambulatory Visit (HOSPITAL_COMMUNITY)
Admission: RE | Admit: 2013-07-20 | Discharge: 2013-07-20 | Disposition: A | Discharge status: Home / Self Care | Payer: BC Managed Care – PPO | Source: Ambulatory Visit | Attending: Interventional Cardiology | Admitting: Interventional Cardiology

## 2013-07-20 ENCOUNTER — Telehealth: Payer: Self-pay | Admitting: Family Medicine

## 2013-07-20 DIAGNOSIS — R9431 Abnormal electrocardiogram [ECG] [EKG]: Secondary | ICD-10-CM | POA: Insufficient documentation

## 2013-07-20 DIAGNOSIS — R0789 Other chest pain: Secondary | ICD-10-CM

## 2013-07-20 DIAGNOSIS — R079 Chest pain, unspecified: Secondary | ICD-10-CM | POA: Insufficient documentation

## 2013-07-20 MED ORDER — TECHNETIUM TC 99M SESTAMIBI GENERIC - CARDIOLITE
10.5000 | Freq: Once | INTRAVENOUS | Status: AC | PRN
  Administered 2013-07-20: 11 via INTRAVENOUS

## 2013-07-20 MED ORDER — TECHNETIUM TC 99M SESTAMIBI GENERIC - CARDIOLITE
29.2000 | Freq: Once | INTRAVENOUS | Status: AC | PRN
  Administered 2013-07-20: 29.2 via INTRAVENOUS

## 2013-07-20 NOTE — Telephone Encounter (Signed)
Relevant patient education mailed to patient.  

## 2013-07-20 NOTE — Procedures (Addendum)
[facility] [address] [place] [address] [phone]  Cardiology Nuclear Med Study  Enrique Sackimothy A Boffa is a 58 y.o. male     MRN : [id number]     DOB: 1956-03-18  Procedure Date: 07/20/2013  Nuclear Med Background Indication for Stress Test:  Evaluation for Ischemia and Abnormal EKG History:  No prior cardiac or respiratory history reported. No prior NUC study for comparrison. Cardiac Risk Factors: Hypertension and Overweight  Symptoms:  Chest Pain, DOE, Fatigue and Light-Headedness   Nuclear Pre-Procedure Caffeine/Decaff Intake:  7:00pm NPO After: 5:00am   IV Site: R Antecubital  IV 0.9% NS with Angio Cath:  22g  Chest Size (in):  44"  IV Started by: Emmit PomfretAmanda Hicks, RN  Height: 5\' 10"  (1.778 m)  Cup Size: n/a  BMI:  Body mass index is 31.14 kg/(m^2). Weight:  217 lb (98.431 kg)   Tech Comments:  n/a    Nuclear Med Study 1 or 2 day study: 1 day  Stress Test Type:  Stress  Order Authorizing Provider:  Catalina GravelJ. Varanasi, MD   Resting Radionuclide: Technetium 71108m Sestamibi  Resting Radionuclide Dose: 10.5 mCi   Stress Radionuclide:  Technetium 22108m Sestamibi  Stress Radionuclide Dose: 29.2 mCi           Stress Protocol Rest HR:63 Stress HR: 173  Rest BP: 156/98 Stress BP:207/104  Exercise Time (min): 11:31 METS: 13.70   Predicted Max HR: 164 bpm % Max HR: 105.49 bpm Rate Pressure Product: [id number]  Dose of Adenosine (mg):  n/a Dose of Lexiscan: n/a mg  Dose of Atropine (mg): n/a Dose of Dobutamine: n/a mcg/kg/min (at max HR)  Stress Test Technologist: Ernestene MentionGwen Farrington, CCT Nuclear Technologist: Gonzella LexPam Phillips, CNMT   Rest Procedure:  Myocardial perfusion imaging was performed at rest 45 minutes following the intravenous administration of Technetium 15108m Sestamibi. Stress Procedure:  The patient performed treadmill exercise using a Bruce  Protocol for 11 minutes and 31 seconds. The patient stopped due to fatigue. Patient  denied any chest pain.  There were no significant ST-T wave changes.  Technetium 63108m Sestamibi was injected at peak exercise and myocardial perfusion imaging was performed after a brief delay.  Transient Ischemic Dilatation (Normal <1.22):  0.78 Lung/Heart Ratio (Normal <0.45):  0.24 QGS EDV:  78 ml QGS ESV:  33 ml LV Ejection Fraction: 58%  Rest ECG: NSR-RBBB  Stress ECG: No significant ST segment change suggestive of ischemia.  QPS Raw Data Images:  Normal; no motion artifact; normal heart/lung ratio. Stress Images:  Normal homogeneous uptake in all areas of the myocardium. Rest Images:  Normal homogeneous uptake in all areas of the myocardium. Subtraction (SDS):  No evidence of ischemia.  Impression Exercise Capacity:  Excellent exercise capacity. BP Response:  Hypertensive blood pressure response. Clinical Symptoms:  No significant symptoms noted. ECG Impression:  No significant ST segment change suggestive of ischemia. Comparison with Prior Nuclear Study: No previous nuclear study performed  Overall Impression:  Normal stress nuclear study. Mild hypertensive response to exercise noted.  LV Wall Motion:  NL LV Function; NL Wall Motion; EF 58%.  Chrystie NoseKenneth C. Ledonna Dormer, MD, [facility] SystemFACC Board Certified in Nuclear Cardiology Attending Cardiologist [facility]  Chrystie NoseHILTY,Praveen Coia C, MD  07/20/2013 12:32 PM

## 2013-07-22 ENCOUNTER — Encounter: Payer: Self-pay | Admitting: Family Medicine

## 2013-07-23 ENCOUNTER — Encounter: Payer: Self-pay | Admitting: Family Medicine

## 2013-07-23 ENCOUNTER — Telehealth: Payer: Self-pay | Admitting: Family Medicine

## 2013-07-23 NOTE — Telephone Encounter (Signed)
Patient needs letter of medical release to return to work. Patient would like to return to work tomorrow. Please call patient when it is ready to pick up. He can pick it up late this afternoon or tomorrow morning at 8AM.

## 2013-07-23 NOTE — Telephone Encounter (Signed)
Please advise 

## 2013-07-23 NOTE — Telephone Encounter (Signed)
Will write letter and print now.

## 2013-07-24 ENCOUNTER — Encounter (HOSPITAL_COMMUNITY): Payer: BC Managed Care – PPO

## 2013-07-24 NOTE — Telephone Encounter (Signed)
Patient picked up letter.

## 2013-09-13 ENCOUNTER — Encounter: Payer: Self-pay | Admitting: Family Medicine

## 2013-09-13 ENCOUNTER — Ambulatory Visit (INDEPENDENT_AMBULATORY_CARE_PROVIDER_SITE_OTHER): Payer: BC Managed Care – PPO | Admitting: Family Medicine

## 2013-09-13 VITALS — BP 132/87 | HR 61 | Temp 98.0°F | Resp 16 | Ht 69.5 in | Wt 215.0 lb

## 2013-09-13 DIAGNOSIS — Z0389 Encounter for observation for other suspected diseases and conditions ruled out: Secondary | ICD-10-CM

## 2013-09-13 DIAGNOSIS — Z0000 Encounter for general adult medical examination without abnormal findings: Secondary | ICD-10-CM

## 2013-09-13 LAB — LIPID PANEL
CHOL/HDL RATIO: 4
CHOLESTEROL: 177 mg/dL (ref 0–200)
HDL: 47.3 mg/dL (ref >39.00)
LDL CALC: 114 mg/dL — AB (ref 0–99)
TRIGLYCERIDES: 79 mg/dL (ref 0.0–149.0)
VLDL: 15.8 mg/dL (ref 0.0–40.0)

## 2013-09-13 LAB — CBC WITH DIFFERENTIAL/PLATELET
BASOS ABS: 0 10*3/uL (ref 0.0–0.1)
BASOS PCT: 0.8 % (ref 0.0–3.0)
EOS ABS: 0.1 10*3/uL (ref 0.0–0.7)
Eosinophils Relative: 2 % (ref 0.0–5.0)
HCT: 47.3 % (ref 39.0–52.0)
Hemoglobin: 15.9 g/dL (ref 13.0–17.0)
LYMPHS PCT: 24.3 % (ref 12.0–46.0)
Lymphs Abs: 1.6 10*3/uL (ref 0.7–4.0)
MCHC: 33.7 g/dL (ref 30.0–36.0)
MCV: 91.8 fl (ref 78.0–100.0)
MONO ABS: 0.6 10*3/uL (ref 0.1–1.0)
Monocytes Relative: 9.7 % (ref 3.0–12.0)
NEUTROS PCT: 63.2 % (ref 43.0–77.0)
Neutro Abs: 4.1 10*3/uL (ref 1.4–7.7)
PLATELETS: 147 10*3/uL — AB (ref 150.0–400.0)
RBC: 5.15 Mil/uL (ref 4.22–5.81)
RDW: 12.7 % (ref 11.5–15.5)
WBC: 6.4 10*3/uL (ref 4.0–10.5)

## 2013-09-13 LAB — COMPREHENSIVE METABOLIC PANEL
ALBUMIN: 4.2 g/dL (ref 3.5–5.2)
ALK PHOS: 50 U/L (ref 39–117)
ALT: 24 U/L (ref 0–53)
AST: 20 U/L (ref 0–37)
BUN: 18 mg/dL (ref 6–23)
CO2: 31 mEq/L (ref 19–32)
Calcium: 10.1 mg/dL (ref 8.4–10.5)
Chloride: 101 mEq/L (ref 96–112)
Creatinine, Ser: 1.1 mg/dL (ref 0.4–1.5)
GFR: 71.22 mL/min (ref >60.00)
Glucose, Bld: 106 mg/dL — ABNORMAL HIGH (ref 70–99)
POTASSIUM: 3.8 meq/L (ref 3.5–5.1)
Sodium: 138 mEq/L (ref 135–145)
Total Bilirubin: 0.9 mg/dL (ref 0.2–1.2)
Total Protein: 7 g/dL (ref 6.0–8.3)

## 2013-09-13 LAB — PSA: PSA: 1 ng/mL (ref 0.10–4.00)

## 2013-09-13 LAB — TSH: TSH: 0.98 u[IU]/mL (ref 0.35–4.50)

## 2013-09-13 NOTE — Progress Notes (Signed)
Office Note 09/13/2013  CC:  Chief Complaint  Patient presents with  . Annual Exam    HPI:  Christopher Hunter is a 58 y.o. White male who is here for CPE.   Past Medical History  Diagnosis Date  . HTN (hypertension) dx'd about 2008  . Nephrolithiasis     No active dz. (Dr. Vernie Ammonsttelin)  . 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 History of migraine headaches   . Atypical chest pain     Past Surgical History  Procedure Laterality Date  . Lithotripsy  around 2009  . Lumbar disc surgery  1994    L5/S1 Dr. Lonny Prudeoby  . Inguinal hernia repair      right  . Cardiovascular stress test  07/2013    Normal EF, no ischemia, mild hypertensive response to exercise    History reviewed. No pertinent family history.  History   Social History  . Marital Status: Married    Spouse Name: N/A    Number of Children: N/A  . Years of Education: N/A   Occupational History  . Not on file.   Social History Main Topics  . Smoking status: Never Smoker   . Smokeless tobacco: Current User    Types: Snuff  . Alcohol Use: No  . Drug Use: No  . Sexual Activity: Not on file   Other Topics Concern  . Not on file   Social History Narrative   Married, one 57 yr old son.   [place]ives in [place], [place].  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   Never smoker, no ETOH, no drugs.          Outpatient Prescriptions Prior to Visit  Medication Sig Dispense Refill  . aspirin 81 MG tablet Take 1 tablet (81 mg total) by mouth daily.  30 tablet  11  . cetirizine (ZYRTEC) 10 MG tablet Take 10 mg by mouth daily.      . enalapril-hydrochlorothiazide (VASERETIC) 10-25 MG per tablet Take 1 tablet by mouth daily.  30 tablet  11  . fluticasone (FLONASE) 50 MCG/ACT nasal spray 2 sprays each nostril once daily  16 g  1  . ibuprofen (ADVIL,MOTRIN) 800 MG tablet Take 1 tablet (800 mg total) by mouth every 6 (six) hours as needed.  30 tablet  2  . Multiple Vitamin (MULTIVITAMIN) tablet  Take 1 tablet by mouth daily.      . nicotine polacrilex (NICORETTE) 2 MG gum Take 1 each (2 mg total) by mouth as needed for smoking cessation.  50 tablet  2  . potassium chloride SA (K-DUR,KLOR-CON) 20 MEQ tablet Take 1 tablet (20 mEq total) by mouth daily.  30 tablet  11   No facility-administered medications prior to visit.    No Known Allergies  ROS Review of Systems  Constitutional: Negative for fever, chills, appetite change and fatigue.  HENT: Negative for congestion, dental problem, ear pain and sore throat.   Eyes: Negative for discharge, redness and visual disturbance.  Respiratory: Negative for cough, chest tightness, shortness of breath and wheezing.   Cardiovascular: Negative for chest pain, palpitations and leg swelling.  Gastrointestinal: Negative for nausea, vomiting, abdominal pain, diarrhea and blood in stool.  Genitourinary: Negative for dysuria, urgency, frequency, hematuria, flank pain and difficulty urinating.  Musculoskeletal: Negative for arthralgias, back pain, joint swelling, myalgias and neck stiffness.  Skin: Negative for pallor and rash.  Neurological: Negative for dizziness, speech difficulty, weakness and headaches.  Hematological: Negative for  adenopathy. Does not bruise/bleed easily.  Psychiatric/Behavioral: Negative for confusion and sleep disturbance. The patient is not nervous/anxious.     PE; Blood pressure 132/87, pulse 61, temperature 98 F (36.7 C), temperature source Temporal, resp. rate 16, height 5' 9.5" (1.765 m), weight 215 lb (97.523 kg), SpO2 99.00%. Gen: Alert, well appearing.  Patient is oriented to person, place, time, and situation. AFFECT: pleasant, lucid thought and speech. ENT: Ears: EACs clear, normal epithelium.  TMs with good light reflex and landmarks bilaterally.  Eyes: no injection, icteris, swelling, or exudate.  EOMI, PERRLA. Nose: no drainage or turbinate edema/swelling.  No injection or focal lesion.  Mouth: lips without  lesion/swelling.  Oral mucosa pink and moist.  Dentition intact and without obvious caries or gingival swelling.  Oropharynx without erythema, exudate, or swelling.  Neck: supple/nontender.  No LAD, mass, or TM.  Carotid pulses 2+ bilaterally, without bruits. CV: RRR, no m/r/g.   LUNGS: CTA bilat, nonlabored resps, good aeration in all lung fields. ABD: soft, NT, ND, BS normal.  No hepatospenomegaly or mass.  No bruits. EXT: no clubbing, cyanosis, or edema.  Musculoskeletal: no joint swelling, erythema, warmth, or tenderness.  ROM of all joints intact. Skin - no sores or suspicious lesions or rashes or color changes.  Many benign-appearing lentiginous lesions and nevi, plus scattered cherry hemangiomata.    Pertinent labs:  None today  ASSESSMENT AND PLAN:   Health maintenance examination Reviewed age and gender appropriate health maintenance issues (prudent diet, regular exercise, health risks of tobacco and excessive alcohol, use of seatbelts, fire alarms in home, use of sunscreen).  Also reviewed age and gender appropriate health screening as well as vaccine recommendations. HP + psa drawn today. DRE normal today. He still needs his first screening colonoscopy and he says he has a plan for this: [place] one recently.  He'll call if referral needed and he'll ask that records be sent to our office if he gets this done there.   An After Visit Summary was printed and given to the patient.  FOLLOW UP:  Return in about 6 months (around 03/16/2014) for routine chronic illness f/u (fasting not necessary).

## 2013-09-13 NOTE — Progress Notes (Signed)
Pre visit review using our clinic review tool, if applicable. No additional management support is needed unless otherwise documented below in the visit note. 

## 2013-09-13 NOTE — Assessment & Plan Note (Addendum)
Reviewed age and gender appropriate health maintenance issues (prudent diet, regular exercise, health risks of tobacco and excessive alcohol, use of seatbelts, fire alarms in home, use of sunscreen).  Also reviewed age and gender appropriate health screening as well as vaccine recommendations. HP + psa drawn today. DRE normal today. He still needs his first screening colonoscopy and he says he has a plan for this: [place] father [place] one recently.  He'll call if referral needed and he'll ask that records be sent to our office if he gets this done there.

## 2013-09-14 ENCOUNTER — Telehealth: Payer: Self-pay | Admitting: Family Medicine

## 2013-09-14 ENCOUNTER — Ambulatory Visit: Payer: BC Managed Care – PPO

## 2013-09-14 DIAGNOSIS — R7301 Impaired fasting glucose: Secondary | ICD-10-CM

## 2013-09-14 LAB — HEMOGLOBIN A1C: HEMOGLOBIN A1C: 5.8 % (ref 4.6–6.5)

## 2013-09-14 NOTE — Telephone Encounter (Signed)
Relevant patient education assigned to patient using Emmi. ° °

## 2013-09-22 ENCOUNTER — Other Ambulatory Visit: Payer: Self-pay | Admitting: Family Medicine

## 2013-09-25 NOTE — Telephone Encounter (Signed)
Patient requesting rf of ibuprofen 800 mg.  Patient last seen 09/13/13.  Next OV is 04/02/14.  Last rf was 12/19/12 x 2 refills.  Please advise refill.

## 2013-11-22 ENCOUNTER — Emergency Department (HOSPITAL_BASED_OUTPATIENT_CLINIC_OR_DEPARTMENT_OTHER)
Admission: EM | Admit: 2013-11-22 | Discharge: 2013-11-22 | Disposition: A | Discharge status: Home / Self Care | Payer: BC Managed Care – PPO | Attending: Emergency Medicine | Admitting: Emergency Medicine

## 2013-11-22 ENCOUNTER — Emergency Department (HOSPITAL_BASED_OUTPATIENT_CLINIC_OR_DEPARTMENT_OTHER): Payer: BC Managed Care – PPO

## 2013-11-22 ENCOUNTER — Encounter (HOSPITAL_BASED_OUTPATIENT_CLINIC_OR_DEPARTMENT_OTHER): Payer: Self-pay | Admitting: Emergency Medicine

## 2013-11-22 DIAGNOSIS — R1011 Right upper quadrant pain: Secondary | ICD-10-CM | POA: Insufficient documentation

## 2013-11-22 DIAGNOSIS — R109 Unspecified abdominal pain: Secondary | ICD-10-CM | POA: Insufficient documentation

## 2013-11-22 DIAGNOSIS — Z79899 Other long term (current) drug therapy: Secondary | ICD-10-CM | POA: Insufficient documentation

## 2013-11-22 DIAGNOSIS — Z7982 Long term (current) use of aspirin: Secondary | ICD-10-CM | POA: Insufficient documentation

## 2013-11-22 DIAGNOSIS — I10 Essential (primary) hypertension: Secondary | ICD-10-CM | POA: Insufficient documentation

## 2013-11-22 DIAGNOSIS — Z87448 Personal history of other diseases of urinary system: Secondary | ICD-10-CM | POA: Insufficient documentation

## 2013-11-22 DIAGNOSIS — Z87442 Personal history of urinary calculi: Secondary | ICD-10-CM | POA: Insufficient documentation

## 2013-11-22 DIAGNOSIS — IMO0002 Reserved for concepts with insufficient information to code with codable children: Secondary | ICD-10-CM | POA: Insufficient documentation

## 2013-11-22 LAB — CBC WITH DIFFERENTIAL/PLATELET
Basophils Absolute: 0.1 10*3/uL (ref 0.0–0.1)
Basophils Relative: 1 % (ref 0–1)
Eosinophils Absolute: 0.1 10*3/uL (ref 0.0–0.7)
Eosinophils Relative: 2 % (ref 0–5)
HCT: 42.4 % (ref 39.0–52.0)
HEMOGLOBIN: 15 g/dL (ref 13.0–17.0)
LYMPHS PCT: 28 % (ref 12–46)
Lymphs Abs: 2.3 10*3/uL (ref 0.7–4.0)
MCH: 31.3 pg (ref 26.0–34.0)
MCHC: 35.4 g/dL (ref 30.0–36.0)
MCV: 88.3 fL (ref 78.0–100.0)
MONOS PCT: 11 % (ref 3–12)
Monocytes Absolute: 0.9 10*3/uL (ref 0.1–1.0)
NEUTROS ABS: 4.9 10*3/uL (ref 1.7–7.7)
Neutrophils Relative %: 60 % (ref 43–77)
Platelets: 155 10*3/uL (ref 150–400)
RBC: 4.8 MIL/uL (ref 4.22–5.81)
RDW: 12.2 % (ref 11.5–15.5)
WBC: 8.2 10*3/uL (ref 4.0–10.5)

## 2013-11-22 LAB — COMPREHENSIVE METABOLIC PANEL
ALK PHOS: 54 U/L (ref 39–117)
ALT: 21 U/L (ref 0–53)
ANION GAP: 16 — AB (ref 5–15)
AST: 18 U/L (ref 0–37)
Albumin: 4 g/dL (ref 3.5–5.2)
BILIRUBIN TOTAL: 0.3 mg/dL (ref 0.3–1.2)
BUN: 17 mg/dL (ref 6–23)
CHLORIDE: 101 meq/L (ref 96–112)
CO2: 26 mEq/L (ref 19–32)
Calcium: 10.1 mg/dL (ref 8.4–10.5)
Creatinine, Ser: 1 mg/dL (ref 0.50–1.35)
GFR calc Af Amer: >90 mL/min (ref >90)
GFR calc non Af Amer: 82 mL/min — ABNORMAL LOW (ref >90)
Glucose, Bld: 118 mg/dL — ABNORMAL HIGH (ref 70–99)
POTASSIUM: 3.5 meq/L — AB (ref 3.7–5.3)
Sodium: 143 mEq/L (ref 137–147)
Total Protein: 7.1 g/dL (ref 6.0–8.3)

## 2013-11-22 LAB — LIPASE, BLOOD: Lipase: 50 U/L (ref 11–59)

## 2013-11-22 MED ORDER — HYDROCODONE-ACETAMINOPHEN 5-325 MG PO TABS: 1.0000 | ORAL_TABLET | ORAL | Status: DC | PRN

## 2013-11-22 MED ORDER — PANTOPRAZOLE SODIUM 20 MG PO TBEC: 20.0000 mg | DELAYED_RELEASE_TABLET | Freq: Every day | ORAL | Status: DC

## 2013-11-22 NOTE — ED Provider Notes (Signed)
CSN: [id number]     Arrival date & time 11/22/13  1934 History   First MD Initiated Contact with Patient 11/22/13 1947     Chief Complaint  Patient presents with  . Abdominal Pain     (Consider location/radiation/quality/duration/timing/severity/associated sxs/prior Treatment) Patient is a 58 y.o. male presenting with abdominal pain. The history is provided by the patient. No language interpreter was used.  Abdominal Pain Pain location:  RUQ Pain quality: sharp and shooting   Associated symptoms: no chest pain, no cough, no fever, no nausea, no shortness of breath and no vomiting   Associated symptoms comment:  RUQ abdominal pain that started one week ago as a fleeting sharp, intermittent pain that has persisted since onset, increasing in frequency today. No N, V, fever. He reports an increase in gas (belching and flatulence). He used a laxative today with a large bowel movement but did not experience any change in the abdominal pain. It is not affected by eating or position. No chest pain, cough or SOB.   Past Medical History  Diagnosis Date  . HTN (hypertension) dx'd about 2008  . Nephrolithiasis     No active dz. (Dr. Vernie Ammons)  . 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 History of migraine headaches   . Atypical chest pain    Past Surgical History  Procedure Laterality Date  . Lithotripsy  around 2009  . Lumbar disc surgery  1994    L5/S1 Dr. Lonny Prude  . Inguinal hernia repair      right  . Cardiovascular stress test  07/2013    Normal EF, no ischemia, mild hypertensive response to exercise   No family history on file. History  Substance Use Topics  . Smoking status: Never Smoker   . Smokeless tobacco: Current User    Types: Snuff  . Alcohol Use: No    Review of Systems  Constitutional: Negative for fever.  Respiratory: Negative for cough and shortness of breath.   Cardiovascular: Negative for chest pain.  Gastrointestinal: Positive for abdominal  pain. Negative for nausea and vomiting.  Genitourinary: Negative for difficulty urinating.  Musculoskeletal: Negative for back pain.      Allergies  Review of patient's allergies indicates no known allergies.  Home Medications   Prior to Admission medications   Medication Sig Start Date End Date Taking? Authorizing Provider  aspirin 81 MG tablet Take 1 tablet (81 mg total) by mouth daily. 03/26/13   Jeoffrey Massed, MD  cetirizine (ZYRTEC) 10 MG tablet Take 10 mg by mouth daily.    Historical Provider, MD  enalapril-hydrochlorothiazide (VASERETIC) 10-25 MG per tablet Take 1 tablet by mouth daily. 03/26/13   Jeoffrey Massed, MD  fluticasone Aleda Grana) 50 MCG/ACT nasal spray 2 sprays each nostril once daily 01/26/13   Jeoffrey Massed, MD  ibuprofen (ADVIL,MOTRIN) 800 MG tablet TAKE ONE (1) TABLET EVERY 6 HOURS AS NEEDED    Jeoffrey Massed, MD  Multiple Vitamin (MULTIVITAMIN) tablet Take 1 tablet by mouth daily.    Historical Provider, MD  nicotine polacrilex (NICORETTE) 2 MG gum Take 1 each (2 mg total) by mouth as needed for smoking cessation. 08/31/12   Jeoffrey Massed, MD  potassium chloride SA (K-DUR,KLOR-CON) 20 MEQ tablet Take 1 tablet (20 mEq total) by mouth daily. 03/26/13   Jeoffrey Massed, MD   BP 133/96  Pulse 74  Temp(Src) 98.4 F (36.9 C) (Oral)  Resp 18  Ht 5\' 10"  (1.778 m)  Wt 216 lb (97.977 kg)  BMI 30.99 kg/m2  SpO2 98% Physical Exam  Constitutional: He is oriented to person, place, and time. He appears well-developed and well-nourished.  HENT:  Head: Normocephalic.  Neck: Normal range of motion. Neck supple.  Cardiovascular: Normal rate and regular rhythm.   Pulmonary/Chest: Effort normal and breath sounds normal.  Abdominal: Soft. Bowel sounds are normal. There is no tenderness. There is no rebound and no guarding.  Minimal RUQ abdominal pain to deep palpation.  Musculoskeletal: Normal range of motion.  Neurological: He is alert and oriented to person,  place, and time.  Skin: Skin is warm and dry. No rash noted.  Psychiatric: He has a normal mood and affect.    ED Course  Procedures (including critical care time) Labs Review Labs Reviewed  COMPREHENSIVE METABOLIC PANEL - Abnormal; Notable for the following:    Potassium 3.5 (*)    Glucose, Bld 118 (*)    GFR calc non Af Amer 82 (*)    Anion gap 16 (*)    All other components within normal limits  CBC WITH DIFFERENTIAL  LIPASE, BLOOD   Results for orders placed during the hospital encounter of 11/22/13  CBC WITH DIFFERENTIAL      Result Value Ref Range   WBC 8.2  4.0 - 10.5 K/uL   RBC 4.80  4.22 - 5.81 MIL/uL   Hemoglobin 15.0  13.0 - 17.0 g/dL   HCT 16.1  09.6 - 04.5 %   MCV 88.3  78.0 - 100.0 fL   MCH 31.3  26.0 - 34.0 pg   MCHC 35.4  30.0 - 36.0 g/dL   RDW 40.9  81.1 - 91.4 %   Platelets 155  150 - 400 K/uL   Neutrophils Relative % 60  43 - 77 %   Neutro Abs 4.9  1.7 - 7.7 K/uL   Lymphocytes Relative 28  12 - 46 %   Lymphs Abs 2.3  0.7 - 4.0 K/uL   Monocytes Relative 11  3 - 12 %   Monocytes Absolute 0.9  0.1 - 1.0 K/uL   Eosinophils Relative 2  0 - 5 %   Eosinophils Absolute 0.1  0.0 - 0.7 K/uL   Basophils Relative 1  0 - 1 %   Basophils Absolute 0.1  0.0 - 0.1 K/uL  COMPREHENSIVE METABOLIC PANEL      Result Value Ref Range   Sodium 143  137 - 147 mEq/L   Potassium 3.5 (*) 3.7 - 5.3 mEq/L   Chloride 101  96 - 112 mEq/L   CO2 26  19 - 32 mEq/L   Glucose, Bld 118 (*) 70 - 99 mg/dL   BUN 17  6 - 23 mg/dL   Creatinine, Ser 7.82  0.50 - 1.35 mg/dL   Calcium 95.6  8.4 - 21.3 mg/dL   Total Protein 7.1  6.0 - 8.3 g/dL   Albumin 4.0  3.5 - 5.2 g/dL   AST 18  0 - 37 U/L   ALT 21  0 - 53 U/L   Alkaline Phosphatase 54  39 - 117 U/L   Total Bilirubin 0.3  0.3 - 1.2 mg/dL   GFR calc non Af Amer 82 (*) >90 mL/min   GFR calc Af Amer >90  >90 mL/min   Anion gap 16 (*) 5 - 15  LIPASE, BLOOD      Result Value Ref Range   Lipase 50  11 - 59 U/L    Imaging Review  No  results found.   EKG Interpretation None      MDM   Final diagnoses:  None    1. Abdominal pain, RUQ  Patient care transferred to Dr. Romeo AppleHarrison pending ultrasound results.    Arnoldo HookerShari A Zekiel Torian, PA-C 11/22/13 2210

## 2013-11-22 NOTE — Discharge Instructions (Signed)

## 2013-11-22 NOTE — ED Notes (Signed)
RUQ pain intermittent x 1 week

## 2013-11-23 NOTE — ED Provider Notes (Signed)
Medical screening examination/treatment/procedure(s) were conducted as a shared visit with non-physician practitioner(s) and myself.  I personally evaluated the patient during the encounter.   EKG Interpretation None      I interviewed and examined the patient. Lungs are CTAB. Cardiac exam wnl. Abdomen soft. Pt points to RUQ but pain not really reproducible w/ palpation. I interpreted/reviewed the labs and/or imaging which were non-contributory.  Pt continues to appear well. Will place on ppi and give pain control. Will rec f/u w/ pcp next week if sx continue.   Junius ArgyleForrest S Vernona Peake, MD 11/23/13 1705

## 2014-01-28 ENCOUNTER — Other Ambulatory Visit: Payer: Self-pay | Admitting: Family Medicine

## 2014-01-28 MED ORDER — PANTOPRAZOLE SODIUM 20 MG PO TBEC: 20.0000 mg | DELAYED_RELEASE_TABLET | Freq: Every day | ORAL | Status: DC

## 2014-04-02 ENCOUNTER — Ambulatory Visit (INDEPENDENT_AMBULATORY_CARE_PROVIDER_SITE_OTHER): Payer: BC Managed Care – PPO | Admitting: Family Medicine

## 2014-04-02 ENCOUNTER — Encounter: Payer: Self-pay | Admitting: Family Medicine

## 2014-04-02 VITALS — BP 147/89 | HR 68 | Temp 97.2°F | Resp 18 | Ht 69.5 in | Wt 220.0 lb

## 2014-04-02 DIAGNOSIS — I10 Essential (primary) hypertension: Secondary | ICD-10-CM

## 2014-04-02 MED ORDER — IRBESARTAN 300 MG PO TABS: 300.0000 mg | ORAL_TABLET | Freq: Every day | ORAL | Status: DC

## 2014-04-02 NOTE — Progress Notes (Signed)
Pre visit review using our clinic review tool, if applicable. No additional management support is needed unless otherwise documented below in the visit note. 

## 2014-04-02 NOTE — Progress Notes (Signed)
OFFICE NOTE  04/02/2014  CC:  Chief Complaint  Patient presents with  . Follow-up     HPI: Patient is a 58 y.o. Caucasian male who is here for 6 mo f/u HTN. No home bp monitoring.  Less than 100% compliance with med due to the diuretic effect that leads to excessive urination.  He is tired of this effect and asks to be switched to bp med w/out diuretic. He can't recall the name of the bp med he was on prior to vaseretic.  Pertinent PMH:  Past medical, surgical, social, and family history reviewed and no changes are noted since last office visit.  MEDS:  Outpatient Prescriptions Prior to Visit  Medication Sig Dispense Refill  . aspirin 81 MG tablet Take 1 tablet (81 mg total) by mouth daily. 30 tablet 11  . cetirizine (ZYRTEC) 10 MG tablet Take 10 mg by mouth daily.    . fluticasone (FLONASE) 50 MCG/ACT nasal spray 2 sprays each nostril once daily 16 g 1  . HYDROcodone-acetaminophen (NORCO/VICODIN) 5-325 MG per tablet Take 1-2 tablets by mouth every 4 (four) hours as needed. 12 tablet 0  . ibuprofen (ADVIL,MOTRIN) 800 MG tablet TAKE ONE (1) TABLET EVERY 6 HOURS AS NEEDED 60 tablet 3  . Multiple Vitamin (MULTIVITAMIN) tablet Take 1 tablet by mouth daily.    . nicotine polacrilex (NICORETTE) 2 MG gum Take 1 each (2 mg total) by mouth as needed for smoking cessation. 50 tablet 2  . enalapril-hydrochlorothiazide (VASERETIC) 10-25 MG per tablet Take 1 tablet by mouth daily. 30 tablet 11  . potassium chloride SA (K-DUR,KLOR-CON) 20 MEQ tablet Take 1 tablet (20 mEq total) by mouth daily. 30 tablet 11  . pantoprazole (PROTONIX) 20 MG tablet Take 1 tablet (20 mg total) by mouth daily. (Patient not taking: Reported on 04/02/2014) 30 tablet 3   No facility-administered medications prior to visit.    PE: Blood pressure 147/89, pulse 68, temperature 97.2 F (36.2 C), temperature source Temporal, resp. rate 18, height 5' 9.5" (1.765 m), weight 220 lb (99.791 kg), SpO2 98 %. Gen: Alert, well  appearing.  Patient is oriented to person, place, time, and situation. CV: RRR, no m/r/g.   LUNGS: CTA bilat, nonlabored resps, good aeration in all lung fields.   IMPRESSION AND PLAN:  HTN, not ideal control. Med intolerance, wants to switch. I'll change him to irbesartan 300 mg qd, discontinue vasoretic and potassium supplement. Monitor bp daily at home, bring numbers to f/u in 3-4 wks and we'll recheck BMET at that time.  An After Visit Summary was printed and given to the patient.  FOLLOW UP: 3-4 wks

## 2014-04-03 ENCOUNTER — Telehealth: Payer: Self-pay | Admitting: Family Medicine

## 2014-04-03 NOTE — Telephone Encounter (Signed)
emmi emailed °

## 2014-04-30 ENCOUNTER — Ambulatory Visit (INDEPENDENT_AMBULATORY_CARE_PROVIDER_SITE_OTHER): Payer: BC Managed Care – PPO | Admitting: Family Medicine

## 2014-04-30 ENCOUNTER — Encounter: Payer: Self-pay | Admitting: Family Medicine

## 2014-04-30 VITALS — BP 162/98 | HR 69 | Temp 98.0°F | Resp 16 | Ht 69.5 in | Wt 220.0 lb

## 2014-04-30 DIAGNOSIS — I10 Essential (primary) hypertension: Secondary | ICD-10-CM

## 2014-04-30 LAB — BASIC METABOLIC PANEL
BUN: 16 mg/dL (ref 6–23)
CALCIUM: 9.7 mg/dL (ref 8.4–10.5)
CHLORIDE: 106 meq/L (ref 96–112)
CO2: 31 mEq/L (ref 19–32)
CREATININE: 1 mg/dL (ref 0.4–1.5)
GFR: 79.08 mL/min (ref >60.00)
Glucose, Bld: 107 mg/dL — ABNORMAL HIGH (ref 70–99)
Potassium: 4.6 mEq/L (ref 3.5–5.1)
Sodium: 143 mEq/L (ref 135–145)

## 2014-04-30 MED ORDER — AMLODIPINE BESYLATE 10 MG PO TABS: 10.0000 mg | ORAL_TABLET | Freq: Every day | ORAL | Status: DC

## 2014-04-30 NOTE — Progress Notes (Signed)
Pre visit review using our clinic review tool, if applicable. No additional management support is needed unless otherwise documented below in the visit note. 

## 2014-04-30 NOTE — Progress Notes (Signed)
Quick Note:  Please call patient and notify him that his blood tests were normal. Continue all medications as discussed at our most recent office visit. Thanks ______

## 2014-04-30 NOTE — Assessment & Plan Note (Signed)
Not well controlled. Continue irbesartan 300mg  qd. Add amlodipine 10mg  qd. Check BMET today. Continue home bp monitoring. F/u in office 4-5 weeks.

## 2014-04-30 NOTE — Progress Notes (Signed)
OFFICE NOTE  04/30/2014  CC:  Chief Complaint  Patient presents with  . Follow-up     HPI: Patient is a 58 y.o. Caucasian male who is here for 1 mo f/u HTN. Switched from vasoretic to irbesartan 300 mg b/c pt got sick and tired of the diuretic effect of the vasoretic. Home bp 150-160s over 80s-90s-this is with wrist cuff.   No signif side effects from irbesartan.  Pertinent PMH:  Past medical, surgical, social, and family history reviewed and no changes are noted since last office visit.  MEDS:  Outpatient Prescriptions Prior to Visit  Medication Sig Dispense Refill  . aspirin 81 MG tablet Take 1 tablet (81 mg total) by mouth daily. 30 tablet 11  . cetirizine (ZYRTEC) 10 MG tablet Take 10 mg by mouth daily.    . fluticasone (FLONASE) 50 MCG/ACT nasal spray 2 sprays each nostril once daily 16 g 1  . HYDROcodone-acetaminophen (NORCO/VICODIN) 5-325 MG per tablet Take 1-2 tablets by mouth every 4 (four) hours as needed. 12 tablet 0  . ibuprofen (ADVIL,MOTRIN) 800 MG tablet TAKE ONE (1) TABLET EVERY 6 HOURS AS NEEDED 60 tablet 3  . irbesartan (AVAPRO) 300 MG tablet Take 1 tablet (300 mg total) by mouth daily. 30 tablet 1  . Multiple Vitamin (MULTIVITAMIN) tablet Take 1 tablet by mouth daily.    . nicotine polacrilex (NICORETTE) 2 MG gum Take 1 each (2 mg total) by mouth as needed for smoking cessation. 50 tablet 2  . pantoprazole (PROTONIX) 20 MG tablet Take 1 tablet (20 mg total) by mouth daily. 30 tablet 3   No facility-administered medications prior to visit.    PE: Blood pressure 162/98, pulse 69, temperature 98 F (36.7 C), temperature source Temporal, resp. rate 16, height 5' 9.5" (1.765 m), weight 220 lb (99.791 kg), SpO2 98 %. Gen: Alert, well appearing.  Patient is oriented to person, place, time, and situation. No further exam today.   IMPRESSION AND PLAN:  HTN (hypertension), benign Not well controlled. Continue irbesartan 300mg  qd. Add amlodipine 10mg  qd. Check  BMET today. Continue home bp monitoring. F/u in office 4-5 weeks.    An After Visit Summary was printed and given to the patient.  FOLLOW UP: 4-5 wks

## 2014-05-30 ENCOUNTER — Other Ambulatory Visit: Payer: Self-pay | Admitting: Family Medicine

## 2014-05-31 ENCOUNTER — Ambulatory Visit (INDEPENDENT_AMBULATORY_CARE_PROVIDER_SITE_OTHER): Payer: BLUE CROSS/BLUE SHIELD | Admitting: Family Medicine

## 2014-05-31 ENCOUNTER — Encounter: Payer: Self-pay | Admitting: Family Medicine

## 2014-05-31 VITALS — BP 137/84 | HR 74 | Temp 98.8°F | Resp 18 | Ht 69.5 in | Wt 225.0 lb

## 2014-05-31 DIAGNOSIS — I10 Essential (primary) hypertension: Secondary | ICD-10-CM

## 2014-05-31 NOTE — Progress Notes (Signed)
OFFICE NOTE  05/31/2014  CC:  Chief Complaint  Patient presents with  . Follow-up    fasting   HPI: Patient is a 59 y.o. Caucasian male who is here for 1 mo f/u poorly controlled HTN. Added 10mg  amlodipine to his irbesartan 300 mg qd last visit. Feeling well, no side effects from meds.  Home bp checks <140/90.  ROS: no HA, no dizziness, no CP, no SOB, no vision complaints  Pertinent PMH:  Past medical, surgical, social, and family history reviewed and no changes are noted since last office visit.  MEDS:  Outpatient Prescriptions Prior to Visit  Medication Sig Dispense Refill  . amLODipine (NORVASC) 10 MG tablet Take 1 tablet (10 mg total) by mouth daily. 30 tablet 3  . aspirin 81 MG tablet Take 1 tablet (81 mg total) by mouth daily. 30 tablet 11  . cetirizine (ZYRTEC) 10 MG tablet Take 10 mg by mouth daily.    . fluticasone (FLONASE) 50 MCG/ACT nasal spray 2 sprays each nostril once daily 16 g 1  . HYDROcodone-acetaminophen (NORCO/VICODIN) 5-325 MG per tablet Take 1-2 tablets by mouth every 4 (four) hours as needed. 12 tablet 0  . ibuprofen (ADVIL,MOTRIN) 800 MG tablet TAKE ONE (1) TABLET EVERY 6 HOURS AS NEEDED 60 tablet 3  . irbesartan (AVAPRO) 300 MG tablet TAKE ONE (1) TABLET BY MOUTH EVERY DAY 30 tablet 3  . Multiple Vitamin (MULTIVITAMIN) tablet Take 1 tablet by mouth daily.    . nicotine polacrilex (NICORETTE) 2 MG gum Take 1 each (2 mg total) by mouth as needed for smoking cessation. 50 tablet 2  . pantoprazole (PROTONIX) 20 MG tablet Take 1 tablet (20 mg total) by mouth daily. 30 tablet 3   No facility-administered medications prior to visit.    PE: Blood pressure 137/84, pulse 74, temperature 98.8 F (37.1 C), temperature source Temporal, resp. rate 18, height 5' 9.5" (1.765 m), weight 225 lb (102.059 kg), SpO2 98 %. Gen: Alert, well appearing.  Patient is oriented to person, place, time, and situation. CV: RRR, no m/r/g.   LUNGS: CTA bilat, nonlabored resps, good  aeration in all lung fields. EXT: no clubbing, cyanosis, or edema.    IMPRESSION AND PLAN:  HTN; The current medical regimen is effective;  continue present plan and medications.  An After Visit Summary was printed and given to the patient.   FOLLOW UP: 6 mo for fasting CPE

## 2014-05-31 NOTE — Progress Notes (Signed)
Pre visit review using our clinic review tool, if applicable. No additional management support is needed unless otherwise documented below in the visit note. 

## 2014-06-03 ENCOUNTER — Telehealth: Payer: Self-pay | Admitting: Family Medicine

## 2014-06-03 NOTE — Telephone Encounter (Signed)
emmi emailed °

## 2014-08-29 ENCOUNTER — Other Ambulatory Visit: Payer: Self-pay | Admitting: Family Medicine

## 2014-10-02 ENCOUNTER — Other Ambulatory Visit: Payer: Self-pay | Admitting: Family Medicine

## 2014-10-25 ENCOUNTER — Other Ambulatory Visit: Payer: Self-pay | Admitting: Family Medicine

## 2014-10-28 NOTE — Telephone Encounter (Signed)
RF request for ibuprofen.  LOV: 05/31/14 Next ov: 11/29/14 Last written: unknown

## 2014-11-18 ENCOUNTER — Encounter: Payer: Self-pay | Admitting: Family Medicine

## 2014-11-18 ENCOUNTER — Ambulatory Visit (INDEPENDENT_AMBULATORY_CARE_PROVIDER_SITE_OTHER): Payer: BLUE CROSS/BLUE SHIELD | Admitting: Family Medicine

## 2014-11-18 VITALS — BP 128/84 | HR 60 | Temp 98.2°F | Resp 16 | Ht 69.0 in | Wt 216.0 lb

## 2014-11-18 DIAGNOSIS — Z125 Encounter for screening for malignant neoplasm of prostate: Secondary | ICD-10-CM | POA: Diagnosis not present

## 2014-11-18 DIAGNOSIS — Z0000 Encounter for general adult medical examination without abnormal findings: Secondary | ICD-10-CM | POA: Diagnosis not present

## 2014-11-18 LAB — CBC WITH DIFFERENTIAL/PLATELET
Basophils Absolute: 0 10*3/uL (ref 0.0–0.1)
Basophils Relative: 0.6 % (ref 0.0–3.0)
Eosinophils Absolute: 0.1 10*3/uL (ref 0.0–0.7)
Eosinophils Relative: 2.4 % (ref 0.0–5.0)
HCT: 46.9 % (ref 39.0–52.0)
Hemoglobin: 15.6 g/dL (ref 13.0–17.0)
LYMPHS ABS: 1.5 10*3/uL (ref 0.7–4.0)
Lymphocytes Relative: 26 % (ref 12.0–46.0)
MCHC: 33.3 g/dL (ref 30.0–36.0)
MCV: 91.6 fl (ref 78.0–100.0)
MONOS PCT: 8.7 % (ref 3.0–12.0)
Monocytes Absolute: 0.5 10*3/uL (ref 0.1–1.0)
NEUTROS PCT: 62.3 % (ref 43.0–77.0)
Neutro Abs: 3.6 10*3/uL (ref 1.4–7.7)
Platelets: 160 10*3/uL (ref 150.0–400.0)
RBC: 5.12 Mil/uL (ref 4.22–5.81)
RDW: 12.2 % (ref 11.5–15.5)
WBC: 5.7 10*3/uL (ref 4.0–10.5)

## 2014-11-18 LAB — COMPREHENSIVE METABOLIC PANEL
ALBUMIN: 4.3 g/dL (ref 3.5–5.2)
ALK PHOS: 68 U/L (ref 39–117)
ALT: 19 U/L (ref 0–53)
AST: 16 U/L (ref 0–37)
BUN: 14 mg/dL (ref 6–23)
CO2: 30 mEq/L (ref 19–32)
Calcium: 9.7 mg/dL (ref 8.4–10.5)
Chloride: 105 mEq/L (ref 96–112)
Creatinine, Ser: 0.99 mg/dL (ref 0.40–1.50)
GFR: 82.62 mL/min (ref >60.00)
Glucose, Bld: 98 mg/dL (ref 70–99)
Potassium: 4.6 mEq/L (ref 3.5–5.1)
Sodium: 142 mEq/L (ref 135–145)
Total Bilirubin: 0.7 mg/dL (ref 0.2–1.2)
Total Protein: 7.1 g/dL (ref 6.0–8.3)

## 2014-11-18 LAB — LIPID PANEL
Cholesterol: 156 mg/dL (ref 0–200)
HDL: 50.7 mg/dL (ref >39.00)
LDL CALC: 87 mg/dL (ref 0–99)
NonHDL: 105.3
Total CHOL/HDL Ratio: 3
Triglycerides: 91 mg/dL (ref 0.0–149.0)
VLDL: 18.2 mg/dL (ref 0.0–40.0)

## 2014-11-18 LAB — TSH: TSH: 1.29 u[IU]/mL (ref 0.35–4.50)

## 2014-11-18 LAB — PSA: PSA: 1.1 ng/mL (ref 0.10–4.00)

## 2014-11-18 MED ORDER — AMLODIPINE BESYLATE 10 MG PO TABS: ORAL_TABLET | ORAL | Status: DC

## 2014-11-18 NOTE — Progress Notes (Signed)
Office Note 11/18/2014  CC:  Chief Complaint  Patient presents with  . Annual Exam    Pt is fasting.    HPI:  Christopher Hunter is a 59 y.o. White male who is here to get annual CPE today.   Starting a new job soon. Declines colon cancer screening at this time.  Home bp monitoring: all good per pt.   Past Medical History  Diagnosis Date  . HTN (hypertension) dx'd about 2008  . Nephrolithiasis     No active dz. (Dr. Vernie Ammons)  . 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 History of migraine headaches   . Atypical chest pain     Past Surgical History  Procedure Laterality Date  . Lithotripsy  around 2009  . Lumbar disc surgery  1994    L5/S1 Dr. Lonny Prude  . Inguinal hernia repair      right  . Cardiovascular stress test  07/2013    Normal EF, no ischemia, mild hypertensive response to exercise    No family history on file.  History   Social History  . Marital Status: Married    Spouse Name: N/A  . Number of Children: N/A  . Years of Education: N/A   Occupational History  . Not on file.   Social History Main Topics  . Smoking status: Never Smoker   . Smokeless tobacco: Current User    Types: Snuff  . Alcohol Use: No  . Drug Use: No  . Sexual Activity: Not on file   Other Topics Concern  . Not on file   Social History Narrative   Married, one 82 yr old son.   Lives in [place], [place].  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   Never smoker, no ETOH, no drugs.          Outpatient Prescriptions Prior to Visit  Medication Sig Dispense Refill  . amLODipine (NORVASC) 10 MG tablet TAKE ONE (1) TABLET BY MOUTH EVERY DAY 30 tablet 3  . aspirin 81 MG tablet Take 1 tablet (81 mg total) by mouth daily. 30 tablet 11  . cetirizine (ZYRTEC) 10 MG tablet Take 10 mg by mouth daily.    . fluticasone (FLONASE) 50 MCG/ACT nasal spray 2 sprays each nostril once daily 16 g 1  . HYDROcodone-acetaminophen (NORCO/VICODIN) 5-325 MG per tablet  Take 1-2 tablets by mouth every 4 (four) hours as needed. 12 tablet 0  . ibuprofen (ADVIL,MOTRIN) 800 MG tablet TAKE ONE TABLET EVERY 6 HOURS AS NEEDED 60 tablet 0  . irbesartan (AVAPRO) 300 MG tablet TAKE ONE TABLET ONCE DAILY 30 tablet 6  . Multiple Vitamin (MULTIVITAMIN) tablet Take 1 tablet by mouth daily.    . nicotine polacrilex (NICORETTE) 2 MG gum Take 1 each (2 mg total) by mouth as needed for smoking cessation. 50 tablet 2  . pantoprazole (PROTONIX) 20 MG tablet Take 1 tablet (20 mg total) by mouth daily. (Patient not taking: Reported on 11/18/2014) 30 tablet 3   No facility-administered medications prior to visit.    No Known Allergies  ROS Review of Systems  Constitutional: Negative for fever, chills, appetite change and fatigue.  HENT: Negative for congestion, dental problem, ear pain and sore throat.   Eyes: Negative for discharge, redness and visual disturbance.  Respiratory: Negative for cough, chest tightness, shortness of breath and wheezing.   Cardiovascular: Negative for chest pain, palpitations and leg swelling.  Gastrointestinal: Negative for nausea, vomiting, abdominal pain, diarrhea and blood  in stool.  Genitourinary: Negative for dysuria, urgency, frequency, hematuria, flank pain and difficulty urinating.  Musculoskeletal: Negative for myalgias, back pain, joint swelling, arthralgias and neck stiffness.  Skin: Negative for pallor and rash.  Neurological: Negative for dizziness, speech difficulty, weakness and headaches.  Hematological: Negative for adenopathy. Does not bruise/bleed easily.  Psychiatric/Behavioral: Negative for confusion and sleep disturbance. The patient is not nervous/anxious.     PE; Blood pressure 128/84, pulse 60, temperature 98.2 F (36.8 C), temperature source Oral, resp. rate 16, height 5\' 9"  (1.753 m), weight 216 lb (97.977 kg), SpO2 99 %. Gen: Alert, well appearing.  Patient is oriented to person, place, time, and situation. AFFECT:  pleasant, lucid thought and speech. ENT: Ears: EACs clear, normal epithelium.  TMs with good light reflex and landmarks bilaterally.  Eyes: no injection, icteris, swelling, or exudate.  EOMI, PERRLA. Nose: no drainage or turbinate edema/swelling.  No injection or focal lesion.  Mouth: lips without lesion/swelling.  Oral mucosa pink and moist.  Dentition intact and without obvious caries or gingival swelling.  Oropharynx without erythema, exudate, or swelling.  Neck: supple/nontender.  No LAD, mass, or TM.  Carotid pulses 2+ bilaterally, without bruits. CV: RRR, no m/r/g.   LUNGS: CTA bilat, nonlabored resps, good aeration in all lung fields. ABD: soft, NT, ND, BS normal.  No hepatospenomegaly or mass.  No bruits. EXT: no clubbing, cyanosis, or edema.  Musculoskeletal: no joint swelling, erythema, warmth, or tenderness.  ROM of all joints intact. Skin - no sores or suspicious lesions or rashes or color changes Rectal exam: negative without mass, lesions or tenderness, PROSTATE EXAM: smooth and symmetric without nodules or tenderness.   Pertinent labs:  Lab Results  Component Value Date   TSH 0.98 09/13/2013   Lab Results  Component Value Date   WBC 8.2 11/22/2013   HGB 15.0 11/22/2013   HCT 42.4 11/22/2013   MCV 88.3 11/22/2013   PLT 155 11/22/2013   Lab Results  Component Value Date   CREATININE 1.0 04/30/2014   BUN 16 04/30/2014   NA 143 04/30/2014   K 4.6 04/30/2014   CL 106 04/30/2014   CO2 31 04/30/2014   Lab Results  Component Value Date   ALT 21 11/22/2013   AST 18 11/22/2013   ALKPHOS 54 11/22/2013   BILITOT 0.3 11/22/2013   Lab Results  Component Value Date   CHOL 177 09/13/2013   Lab Results  Component Value Date   HDL 47.30 09/13/2013   Lab Results  Component Value Date   LDLCALC 114* 09/13/2013   Lab Results  Component Value Date   TRIG 79.0 09/13/2013   Lab Results  Component Value Date   CHOLHDL 4 09/13/2013   Lab Results  Component Value  Date   PSA 1.00 09/13/2013   PSA 0.94 08/31/2012    ASSESSMENT AND PLAN:   Health maintenance exam: Reviewed age and gender appropriate health maintenance issues (prudent diet, regular exercise, health risks of tobacco and excessive alcohol, use of seatbelts, fire alarms in home, use of sunscreen).  Also reviewed age and gender appropriate health screening as well as vaccine recommendations. Vaccines UTD. DRE normal today, PSA pending. He declined colon ca screening today, will think about it next year.  An After Visit Summary was printed and given to the patient.  FOLLOW UP:  No Follow-up on file.

## 2014-11-18 NOTE — Progress Notes (Signed)
Pre visit review using our clinic review tool, if applicable. No additional management support is needed unless otherwise documented below in the visit note. 

## 2014-11-20 ENCOUNTER — Encounter: Payer: BLUE CROSS/BLUE SHIELD | Admitting: Family Medicine

## 2014-11-27 ENCOUNTER — Other Ambulatory Visit: Payer: Self-pay | Admitting: Family Medicine

## 2014-11-28 NOTE — Telephone Encounter (Signed)
RF request for IBP LOV: 11/18/14 Next ov: None Last written: 10/28/14 #60 w/ 0RF Please advise. Thanks.

## 2014-11-29 ENCOUNTER — Encounter: Payer: BLUE CROSS/BLUE SHIELD | Admitting: Family Medicine

## 2014-12-12 ENCOUNTER — Ambulatory Visit (INDEPENDENT_AMBULATORY_CARE_PROVIDER_SITE_OTHER): Payer: Self-pay | Admitting: Family Medicine

## 2014-12-12 ENCOUNTER — Encounter: Payer: Self-pay | Admitting: Family Medicine

## 2014-12-12 VITALS — BP 122/74 | HR 73 | Temp 98.3°F | Resp 16 | Ht 69.0 in | Wt 214.0 lb

## 2014-12-12 DIAGNOSIS — F409 Phobic anxiety disorder, unspecified: Secondary | ICD-10-CM

## 2014-12-12 DIAGNOSIS — F4322 Adjustment disorder with anxiety: Secondary | ICD-10-CM

## 2014-12-12 DIAGNOSIS — F5105 Insomnia due to other mental disorder: Secondary | ICD-10-CM

## 2014-12-12 MED ORDER — CLONAZEPAM 0.5 MG PO TABS: ORAL_TABLET | ORAL | Status: DC

## 2014-12-12 MED ORDER — DULOXETINE HCL 30 MG PO CPEP: 30.0000 mg | ORAL_CAPSULE | Freq: Every day | ORAL | Status: DC

## 2014-12-12 NOTE — Progress Notes (Signed)
Pre visit review using our clinic review tool, if applicable. No additional management support is needed unless otherwise documented below in the visit note. 

## 2014-12-12 NOTE — Progress Notes (Signed)
OFFICE VISIT  12/12/2014   CC:  Chief Complaint  Patient presents with  . Insomnia    x 1 week  . Anxiety    x 1 week   HPI:    Patient is a 59 y.o. Caucasian male who presents for bp concerns and trouble sleeping. Has new job lately, stressed out, has different schedule/expectations, describes an odd call-schedule type work schedule that is causing lots of havoc with his sleep.  He can't relax, has anticipatory anxiety quite a bit.  Working long hours when he does work. Tough transition period in his work-life right now.  He does not foresee the stress level going down with this new job. Denies depressed mood.  No drug or alcohol use.  Thinking about quitting/finding another job but not sure, wants to know his options.  He says he is bp has felt like it has been up lately due to all the stress he is going through.    Past Medical History  Diagnosis Date  . HTN (hypertension) dx'd about 2008  . Nephrolithiasis     No active dz. (Dr. Vernie Ammons)  . 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 History of migraine headaches   . Atypical chest pain     Past Surgical History  Procedure Laterality Date  . Lithotripsy  around 2009  . Lumbar disc surgery  1994    L5/S1 Dr. Lonny Prude  . Inguinal hernia repair      right  . Cardiovascular stress test  07/2013    Normal EF, no ischemia, mild hypertensive response to exercise    Outpatient Prescriptions Prior to Visit  Medication Sig Dispense Refill  . amLODipine (NORVASC) 10 MG tablet TAKE ONE (1) TABLET BY MOUTH EVERY DAY 30 tablet 6  . aspirin 81 MG tablet Take 1 tablet (81 mg total) by mouth daily. 30 tablet 11  . cetirizine (ZYRTEC) 10 MG tablet Take 10 mg by mouth daily.    . fluticasone (FLONASE) 50 MCG/ACT nasal spray 2 sprays each nostril once daily 16 g 1  . HYDROcodone-acetaminophen (NORCO/VICODIN) 5-325 MG per tablet Take 1-2 tablets by mouth every 4 (four) hours as needed. 12 tablet 0  . ibuprofen  (ADVIL,MOTRIN) 800 MG tablet TAKE ONE (1) TABLET EVERY 6 HOURS AS NEEDED 60 tablet 6  . irbesartan (AVAPRO) 300 MG tablet TAKE ONE TABLET ONCE DAILY 30 tablet 6  . Multiple Vitamin (MULTIVITAMIN) tablet Take 1 tablet by mouth daily.    . nicotine polacrilex (NICORETTE) 2 MG gum Take 1 each (2 mg total) by mouth as needed for smoking cessation. 50 tablet 2  . pantoprazole (PROTONIX) 20 MG tablet Take 1 tablet (20 mg total) by mouth daily. (Patient not taking: Reported on 12/12/2014) 30 tablet 3   No facility-administered medications prior to visit.    No Known Allergies  ROS As per HPI  PE: Blood pressure 149/87, pulse 73, temperature 98.3 F (36.8 C), temperature source Oral, resp. rate 16, height  (1.753 m), weight 214 lb (97.07 kg), SpO2 98 %. Gen: Alert, well appearing.  Patient is oriented to person, place, time, and situation. AFFECT: pleasant, lucid thought and speech. No further exam today.  LABS:  Lab Results  Component Value Date   TSH 1.29 11/18/2014   Lab Results  Component Value Date   WBC 5.7 11/18/2014   HGB 15.6 11/18/2014   HCT 46.9 11/18/2014   MCV 91.6 11/18/2014   PLT 160.0 11/18/2014  Lab Results  Component Value Date   CREATININE 0.99 11/18/2014   BUN 14 11/18/2014   NA 142 11/18/2014   K 4.6 11/18/2014   CL 105 11/18/2014   CO2 30 11/18/2014   Lab Results  Component Value Date   ALT 19 11/18/2014   AST 16 11/18/2014   ALKPHOS 68 11/18/2014   BILITOT 0.7 11/18/2014   Lab Results  Component Value Date   CHOL 156 11/18/2014   Lab Results  Component Value Date   HDL 50.70 11/18/2014   Lab Results  Component Value Date   LDLCALC 87 11/18/2014   Lab Results  Component Value Date   TRIG 91.0 11/18/2014   Lab Results  Component Value Date   CHOLHDL 3 11/18/2014   Lab Results  Component Value Date   PSA 1.10 11/18/2014   PSA 1.00 09/13/2013   PSA 0.94 08/31/2012    IMPRESSION AND PLAN:  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  This is leading to impaired sleep initiation. Discussed options today, decided on trial of duloxetine 30mg  qd.  Therapeutic expectations and side effect profile of medication discussed today.  Patient's questions answered. 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 Pt expressed understanding and agreement with the plan.  An After Visit Summary was printed and given to the patient.  Spent 30 min with pt today, with >50% of this time spent in counseling and care coordination regarding the above problems.  FOLLOW UP: No Follow-up on file.

## 2015-01-31 ENCOUNTER — Ambulatory Visit: Payer: Self-pay | Admitting: Family Medicine

## 2015-03-25 ENCOUNTER — Other Ambulatory Visit: Payer: Self-pay | Admitting: Family Medicine

## 2015-03-25 NOTE — Telephone Encounter (Signed)
RF request for duloxetine LOV: 12/12/14 Next ov: 05/11/15 Last written: 12/12/14 #30 w/ 1RF

## 2015-03-26 ENCOUNTER — Other Ambulatory Visit: Payer: Self-pay | Admitting: Family Medicine

## 2015-03-31 NOTE — Telephone Encounter (Signed)
Rx faxed

## 2015-03-31 NOTE — Telephone Encounter (Signed)
RF request for clonazepam LOV: 12/12/14 Next ov: 05/21/15 Last written: 12/12/14 #30 w/ 1RF  Please advise. Thanks.

## 2015-05-20 ENCOUNTER — Other Ambulatory Visit: Payer: Self-pay | Admitting: Family Medicine

## 2015-05-21 ENCOUNTER — Ambulatory Visit (INDEPENDENT_AMBULATORY_CARE_PROVIDER_SITE_OTHER): Payer: 59 | Admitting: Family Medicine

## 2015-05-21 ENCOUNTER — Encounter: Payer: Self-pay | Admitting: Family Medicine

## 2015-05-21 VITALS — BP 140/78 | HR 82 | Temp 98.0°F | Resp 16 | Ht 69.0 in | Wt 215.8 lb

## 2015-05-21 DIAGNOSIS — I10 Essential (primary) hypertension: Secondary | ICD-10-CM | POA: Diagnosis not present

## 2015-05-21 DIAGNOSIS — F4322 Adjustment disorder with anxiety: Secondary | ICD-10-CM | POA: Diagnosis not present

## 2015-05-21 DIAGNOSIS — N200 Calculus of kidney: Secondary | ICD-10-CM

## 2015-05-21 DIAGNOSIS — F5102 Adjustment insomnia: Secondary | ICD-10-CM

## 2015-05-21 MED ORDER — HYDROCODONE-ACETAMINOPHEN 5-325 MG PO TABS: 1.0000 | ORAL_TABLET | Freq: Four times a day (QID) | ORAL | Status: DC | PRN

## 2015-05-21 MED ORDER — AMLODIPINE BESYLATE 10 MG PO TABS: ORAL_TABLET | ORAL | Status: DC

## 2015-05-21 MED ORDER — IRBESARTAN 300 MG PO TABS: 300.0000 mg | ORAL_TABLET | Freq: Every day | ORAL | Status: DC

## 2015-05-21 NOTE — Progress Notes (Signed)
OFFICE VISIT  05/21/2015   CC:  Chief Complaint  Patient presents with  . Follow-up    Pt is not fasting.    HPI:    Patient is a 60 y.o. white male who presents for 6 mo f/u HTN, anxiety, and insomnia. Says he only took 3-4 doses of duloxetine then chose to not keep taking this med, same with clonaz. No adverse side effects, says the anxiety related to his new job calmed down naturally and he says all is fine at this time.  Sleeping fine, no insomnia problems anymore.  No home bp monitoring but he is compliant with meds.  He asks for a few of the vicodin tabs to have on hand in case he should start to pass a kidney stone: he has known nonobstructive stones bilat present per his report from last sonogram via urology.   Past Medical History  Diagnosis Date  . HTN (hypertension) dx'd about 2008  . Nephrolithiasis     No active dz. (Dr. Vernie Ammons)  . 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 History of migraine headaches   . Atypical chest pain     Past Surgical History  Procedure Laterality Date  . Lithotripsy  around 2009  . Lumbar disc surgery  1994    L5/S1 Dr. Lonny Prude  . Inguinal hernia repair      right  . Cardiovascular stress test  07/2013    Normal EF, no ischemia, mild hypertensive response to exercise    Outpatient Prescriptions Prior to Visit  Medication Sig Dispense Refill  . aspirin 81 MG tablet Take 1 tablet (81 mg total) by mouth daily. 30 tablet 11  . cetirizine (ZYRTEC) 10 MG tablet Take 10 mg by mouth daily.    Marland Kitchen ibuprofen (ADVIL,MOTRIN) 800 MG tablet TAKE ONE (1) TABLET EVERY 6 HOURS AS NEEDED 60 tablet 6  . Multiple Vitamin (MULTIVITAMIN) tablet Take 1 tablet by mouth daily.    . nicotine polacrilex (NICORETTE) 2 MG gum Take 1 each (2 mg total) by mouth as needed for smoking cessation. 50 tablet 2  . amLODipine (NORVASC) 10 MG tablet TAKE ONE (1) TABLET BY MOUTH EVERY DAY 30 tablet 6  . irbesartan (AVAPRO) 300 MG tablet TAKE ONE TABLET  ONCE DAILY 30 tablet 6  . clonazePAM (KLONOPIN) 0.5 MG tablet TAKE ONE OR TWO TABLETS AT BEDTIME AS NEEDED FOR INSOMNIA (Patient not taking: Reported on 05/21/2015) 30 tablet 2  . DULoxetine (CYMBALTA) 30 MG capsule TAKE ONE (1) CAPSULE EACH DAY (Patient not taking: Reported on 05/21/2015) 30 capsule 6  . fluticasone (FLONASE) 50 MCG/ACT nasal spray 2 sprays each nostril once daily (Patient not taking: Reported on 05/21/2015) 16 g 1  . HYDROcodone-acetaminophen (NORCO/VICODIN) 5-325 MG per tablet Take 1-2 tablets by mouth every 4 (four) hours as needed. (Patient not taking: Reported on 05/21/2015) 12 tablet 0   No facility-administered medications prior to visit.    No Known Allergies  ROS As per HPI  PE: Blood pressure 152/90, pulse 82, temperature 98 F (36.7 C), temperature source Oral, resp. rate 16, height  (1.753 m), weight 215 lb 12 oz (97.864 kg), SpO2 99 %.  Repeat BP 140/78 Gen: Alert, well appearing.  Patient is oriented to person, place, time, and situation. AFFECT: pleasant, lucid thought and speech. CV: RRR, no m/r/g.   LUNGS: CTA bilat, nonlabored resps, good aeration in all lung fields. EXT: no clubbing, cyanosis, or edema.    LABS:  Lab Results  Component Value Date   TSH 1.29 11/18/2014   Lab Results  Component Value Date   WBC 5.7 11/18/2014   HGB 15.6 11/18/2014   HCT 46.9 11/18/2014   MCV 91.6 11/18/2014   PLT 160.0 11/18/2014   Lab Results  Component Value Date   CREATININE 0.99 11/18/2014   BUN 14 11/18/2014   NA 142 11/18/2014   K 4.6 11/18/2014   CL 105 11/18/2014   CO2 30 11/18/2014   Lab Results  Component Value Date   ALT 19 11/18/2014   AST 16 11/18/2014   ALKPHOS 68 11/18/2014   BILITOT 0.7 11/18/2014   Lab Results  Component Value Date   CHOL 156 11/18/2014   Lab Results  Component Value Date   HDL 50.70 11/18/2014   Lab Results  Component Value Date   LDLCALC 87 11/18/2014   Lab Results  Component Value Date   TRIG  91.0 11/18/2014   Lab Results  Component Value Date   CHOLHDL 3 11/18/2014   Lab Results  Component Value Date   PSA 1.10 11/18/2014   PSA 1.00 09/13/2013   PSA 0.94 08/31/2012    IMPRESSION AND PLAN:  1) Ess HTN: The current medical regimen is effective;  continue present plan and medications.  2) Anxiety and insomnia: resolved-this was related to a tough transition into a new job and he is doing fine now.  3) Kidney stone dz: nonobstructive.  I agreed to rx #20 vicodin tabs for him to have on hand in case he starts to pass a stone.  An After Visit Summary was printed and given to the patient.  FOLLOW UP: Return in about 6 months (around 11/18/2015) for annual CPE (fasting).

## 2015-05-21 NOTE — Progress Notes (Signed)
Pre visit review using our clinic review tool, if applicable. No additional management support is needed unless otherwise documented below in the visit note. 

## 2015-12-02 ENCOUNTER — Encounter: Payer: Self-pay | Admitting: Family Medicine

## 2015-12-02 ENCOUNTER — Ambulatory Visit (INDEPENDENT_AMBULATORY_CARE_PROVIDER_SITE_OTHER): Payer: 59 | Admitting: Family Medicine

## 2015-12-02 VITALS — BP 128/94 | HR 72 | Temp 97.7°F | Resp 16 | Ht 69.5 in | Wt 209.5 lb

## 2015-12-02 DIAGNOSIS — Z0000 Encounter for general adult medical examination without abnormal findings: Secondary | ICD-10-CM | POA: Diagnosis not present

## 2015-12-02 DIAGNOSIS — Z125 Encounter for screening for malignant neoplasm of prostate: Secondary | ICD-10-CM

## 2015-12-02 LAB — COMPREHENSIVE METABOLIC PANEL
ALT: 19 U/L (ref 0–53)
AST: 15 U/L (ref 0–37)
Albumin: 4.4 g/dL (ref 3.5–5.2)
Alkaline Phosphatase: 66 U/L (ref 39–117)
BUN: 15 mg/dL (ref 6–23)
CALCIUM: 9.6 mg/dL (ref 8.4–10.5)
CO2: 30 meq/L (ref 19–32)
Chloride: 106 mEq/L (ref 96–112)
Creatinine, Ser: 0.98 mg/dL (ref 0.40–1.50)
GFR: 83.29 mL/min (ref >60.00)
GLUCOSE: 104 mg/dL — AB (ref 70–99)
POTASSIUM: 4.2 meq/L (ref 3.5–5.1)
Sodium: 144 mEq/L (ref 135–145)
Total Bilirubin: 0.6 mg/dL (ref 0.2–1.2)
Total Protein: 7.1 g/dL (ref 6.0–8.3)

## 2015-12-02 LAB — CBC WITH DIFFERENTIAL/PLATELET
Basophils Absolute: 0 10*3/uL (ref 0.0–0.1)
Basophils Relative: 0.7 % (ref 0.0–3.0)
EOS PCT: 1.2 % (ref 0.0–5.0)
Eosinophils Absolute: 0.1 10*3/uL (ref 0.0–0.7)
HCT: 45 % (ref 39.0–52.0)
HEMOGLOBIN: 15 g/dL (ref 13.0–17.0)
LYMPHS ABS: 1.2 10*3/uL (ref 0.7–4.0)
Lymphocytes Relative: 21.2 % (ref 12.0–46.0)
MCHC: 33.3 g/dL (ref 30.0–36.0)
MCV: 91.5 fl (ref 78.0–100.0)
MONOS PCT: 11.4 % (ref 3.0–12.0)
Monocytes Absolute: 0.7 10*3/uL (ref 0.1–1.0)
NEUTROS PCT: 65.5 % (ref 43.0–77.0)
Neutro Abs: 3.8 10*3/uL (ref 1.4–7.7)
Platelets: 156 10*3/uL (ref 150.0–400.0)
RBC: 4.92 Mil/uL (ref 4.22–5.81)
RDW: 13 % (ref 11.5–15.5)
WBC: 5.8 10*3/uL (ref 4.0–10.5)

## 2015-12-02 LAB — LIPID PANEL
Cholesterol: 156 mg/dL (ref 0–200)
HDL: 60.1 mg/dL (ref >39.00)
LDL CALC: 86 mg/dL (ref 0–99)
NONHDL: 95.67
Total CHOL/HDL Ratio: 3
Triglycerides: 48 mg/dL (ref 0.0–149.0)
VLDL: 9.6 mg/dL (ref 0.0–40.0)

## 2015-12-02 LAB — TSH: TSH: 0.99 u[IU]/mL (ref 0.35–4.50)

## 2015-12-02 LAB — PSA: PSA: 1.16 ng/mL (ref 0.10–4.00)

## 2015-12-02 MED ORDER — IRBESARTAN 300 MG PO TABS: 300.0000 mg | ORAL_TABLET | Freq: Every day | ORAL | 6 refills | Status: DC

## 2015-12-02 MED ORDER — IBUPROFEN 800 MG PO TABS: ORAL_TABLET | ORAL | 6 refills | Status: DC

## 2015-12-02 MED ORDER — AMLODIPINE BESYLATE 10 MG PO TABS: ORAL_TABLET | ORAL | 6 refills | Status: DC

## 2015-12-02 NOTE — Addendum Note (Signed)
Addended by: Smitty Knudsen on: 12/02/2015 08:53 AM   Modules accepted: Orders

## 2015-12-02 NOTE — Progress Notes (Signed)
Office Note 12/02/2015  CC:  Chief Complaint  Patient presents with  . Annual Exam    Pt is fasting.     HPI:  Christopher Hunter is a 60 y.o. White male who is here for annual health maintenance exam. Exercise: "just work".  Eye exam: in the last year. Dental preventative visits UTD.  Rarely checks bp outside of office, but the other day at his parents house it was 130s/80s. Compliant with both bp meds daily.  No acute complaints.  Past Medical History:  Diagnosis Date  . Atypical chest pain   . History of migraine headaches   . HTN (hypertension) dx'd about 2008  . Nephrolithiasis    No active dz. (Dr. Vernie Ammons)  . 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 CARDIOVASCULAR STRESS TEST  07/2013   Normal EF, no ischemia, mild hypertensive response to exercise  . INGUINAL HERNIA REPAIR     right  . LITHOTRIPSY  around 2009  . LUMBAR DISC SURGERY  1994   L5/S1 Dr. Lonny Prude    History reviewed. No pertinent family history.  Social History   Social History  . Marital status: Married    Spouse name: N/A  . Number of children: N/A  . Years of education: N/A   Occupational History  . Not on file.   Social History Main Topics  . Smoking status: Never Smoker  . Smokeless tobacco: Current User    Types: Snuff  . Alcohol use No  . Drug use: No  . Sexual activity: Not on file   Other Topics Concern  . Not on file   Social History Narrative   Married, one 24 yr old son.   Lives in [place], [place].  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   Never smoker, no ETOH, no drugs.          Outpatient Medications Prior to Visit  Medication Sig Dispense Refill  . amLODipine (NORVASC) 10 MG tablet TAKE ONE (1) TABLET BY MOUTH EVERY DAY 30 tablet 6  . aspirin 81 MG tablet Take 1 tablet (81 mg total) by mouth daily. 30 tablet 11  . cetirizine (ZYRTEC) 10 MG tablet Take 10 mg by mouth daily.     Marland Kitchen HYDROcodone-acetaminophen (NORCO/VICODIN) 5-325 MG tablet Take 1-2 tablets by mouth every 6 (six) hours as needed. 20 tablet 0  . ibuprofen (ADVIL,MOTRIN) 800 MG tablet TAKE ONE (1) TABLET EVERY 6 HOURS AS NEEDED 60 tablet 6  . irbesartan (AVAPRO) 300 MG tablet Take 1 tablet (300 mg total) by mouth daily. 30 tablet 6  . Multiple Vitamin (MULTIVITAMIN) tablet Take 1 tablet by mouth daily.    . nicotine polacrilex (NICORETTE) 2 MG gum Take 1 each (2 mg total) by mouth as needed for smoking cessation. 50 tablet 2   No facility-administered medications prior to visit.     No Known Allergies  ROS Review of Systems  Constitutional: Negative for appetite change, chills, fatigue and fever.  HENT: Negative for congestion, dental problem, ear pain and sore throat.   Eyes: Negative for discharge, redness and visual disturbance.  Respiratory: Negative for cough, chest tightness, shortness of breath and wheezing.   Cardiovascular: Negative for chest pain, palpitations and leg swelling.  Gastrointestinal: Negative for abdominal pain, blood in stool, diarrhea, nausea and vomiting.  Genitourinary: Negative for difficulty urinating, dysuria, flank pain, frequency, hematuria and urgency.  Musculoskeletal: Negative for arthralgias, back  pain, joint swelling, myalgias and neck stiffness.  Skin: Negative for pallor and rash.  Neurological: Negative for dizziness, speech difficulty, weakness and headaches.  Hematological: Negative for adenopathy. Does not bruise/bleed easily.  Psychiatric/Behavioral: Negative for confusion and sleep disturbance. The patient is not nervous/anxious.     PE; Blood pressure (!) 155/94, pulse 72, temperature 97.7 F (36.5 C), temperature source Oral, resp. rate 16, height 5' 9.5" (1.765 m), weight 209 lb 8 oz (95 kg), SpO2 100 %. Gen: Alert, well appearing.  Patient is oriented to person, place, time, and situation. AFFECT: pleasant, lucid thought and speech. ENT: Ears:  EACs clear, normal epithelium.  TMs with good light reflex and landmarks bilaterally.  Eyes: no injection, icteris, swelling, or exudate.  EOMI, PERRLA. Nose: no drainage or turbinate edema/swelling.  No injection or focal lesion.  Mouth: lips without lesion/swelling.  Oral mucosa pink and moist.  Dentition intact and without obvious caries or gingival swelling.  Oropharynx without erythema, exudate, or swelling.  Neck: supple/nontender.  No LAD, mass, or TM.  Carotid pulses 2+ bilaterally, without bruits. CV: RRR, no m/r/g.   LUNGS: CTA bilat, nonlabored resps, good aeration in all lung fields. ABD: soft, NT, ND, BS normal.  No hepatospenomegaly or mass.  No bruits. EXT: no clubbing, cyanosis, or edema.  Musculoskeletal: no joint swelling, erythema, warmth, or tenderness.  ROM of all joints intact. Skin - no sores or suspicious lesions or rashes or color changes Rectal exam: negative without mass, lesions or tenderness, PROSTATE EXAM: smooth and symmetric without nodules or tenderness.   Pertinent labs:  Lab Results  Component Value Date   TSH 1.29 11/18/2014   Lab Results  Component Value Date   WBC 5.7 11/18/2014   HGB 15.6 11/18/2014   HCT 46.9 11/18/2014   MCV 91.6 11/18/2014   PLT 160.0 11/18/2014   Lab Results  Component Value Date   CREATININE 0.99 11/18/2014   BUN 14 11/18/2014   NA 142 11/18/2014   K 4.6 11/18/2014   CL 105 11/18/2014   CO2 30 11/18/2014   Lab Results  Component Value Date   ALT 19 11/18/2014   AST 16 11/18/2014   ALKPHOS 68 11/18/2014   BILITOT 0.7 11/18/2014   Lab Results  Component Value Date   CHOL 156 11/18/2014   Lab Results  Component Value Date   HDL 50.70 11/18/2014   Lab Results  Component Value Date   LDLCALC 87 11/18/2014   Lab Results  Component Value Date   TRIG 91.0 11/18/2014   Lab Results  Component Value Date   CHOLHDL 3 11/18/2014   Lab Results  Component Value Date   PSA 1.10 11/18/2014   PSA 1.00  09/13/2013   PSA 0.94 08/31/2012   Lab Results  Component Value Date   HGBA1C 5.8 09/14/2013    ASSESSMENT AND PLAN:   Health maintenance exam: Reviewed age and gender appropriate health maintenance issues (prudent diet, regular exercise, health risks of tobacco and excessive alcohol, use of seatbelts, fire alarms in home, use of sunscreen).  Also reviewed age and gender appropriate health screening as well as vaccine recommendations. Fasting health panel labs drawn today. Prostate ca screening: DRE normal, PSA drawn today. Colon ca screening: he wants to get a colonoscopy (his first screening one) at a GI office in [place] where a relative has gone.  He'll call back with the details and then we'll order the referral. No vaccines due today.  An After Visit Summary was printed  and given to the patient.  FOLLOW UP:  Return in about 6 months (around 06/03/2016) for routine chronic illness f/u.  Signed:  Santiago Bumpers, MD           12/02/2015

## 2015-12-02 NOTE — Patient Instructions (Signed)
Call back to our office and let us know the name of the Gastroenterology office in [place] that you want a referral to (for colon cancer screening).

## 2015-12-02 NOTE — Progress Notes (Signed)
Pre visit review using our clinic review tool, if applicable. No additional management support is needed unless otherwise documented below in the visit note. 

## 2015-12-03 ENCOUNTER — Other Ambulatory Visit (INDEPENDENT_AMBULATORY_CARE_PROVIDER_SITE_OTHER): Payer: 59

## 2015-12-03 ENCOUNTER — Telehealth: Payer: Self-pay | Admitting: *Deleted

## 2015-12-03 ENCOUNTER — Encounter: Payer: Self-pay | Admitting: Family Medicine

## 2015-12-03 DIAGNOSIS — R7301 Impaired fasting glucose: Secondary | ICD-10-CM

## 2015-12-03 LAB — HEMOGLOBIN A1C: Hgb A1c MFr Bld: 5.7 % (ref 4.6–6.5)

## 2015-12-03 NOTE — Telephone Encounter (Signed)
Pt LMOM on 12/03/15 at 11:13am requesting that form and lab reqults form recent visit be mailed to him. He stated that they are not quite due yet and he does not need to send them in yet. Form completed waiting on lab results.

## 2015-12-04 NOTE — Telephone Encounter (Signed)
Pt advised of lab results, copy of form made for chart. Lab results printed and put with form to be mailed to address in EMR per pts request. Mailing address confirmed.

## 2016-06-10 ENCOUNTER — Encounter: Payer: Self-pay | Admitting: Family Medicine

## 2016-06-10 ENCOUNTER — Ambulatory Visit (INDEPENDENT_AMBULATORY_CARE_PROVIDER_SITE_OTHER): Payer: 59 | Admitting: Family Medicine

## 2016-06-10 VITALS — BP 165/92 | HR 73 | Temp 98.3°F | Resp 16 | Ht 69.5 in | Wt 210.0 lb

## 2016-06-10 DIAGNOSIS — I10 Essential (primary) hypertension: Secondary | ICD-10-CM | POA: Diagnosis not present

## 2016-06-10 DIAGNOSIS — N200 Calculus of kidney: Secondary | ICD-10-CM | POA: Diagnosis not present

## 2016-06-10 LAB — BASIC METABOLIC PANEL
BUN: 18 mg/dL (ref 6–23)
CHLORIDE: 104 meq/L (ref 96–112)
CO2: 33 mEq/L — ABNORMAL HIGH (ref 19–32)
Calcium: 9.6 mg/dL (ref 8.4–10.5)
Creatinine, Ser: 1.01 mg/dL (ref 0.40–1.50)
GFR: 80.3 mL/min (ref >60.00)
Glucose, Bld: 114 mg/dL — ABNORMAL HIGH (ref 70–99)
Potassium: 4.3 mEq/L (ref 3.5–5.1)
SODIUM: 142 meq/L (ref 135–145)

## 2016-06-10 MED ORDER — HYDROCODONE-ACETAMINOPHEN 5-325 MG PO TABS: 1.0000 | ORAL_TABLET | Freq: Four times a day (QID) | ORAL | 0 refills | Status: DC | PRN

## 2016-06-10 MED ORDER — METOPROLOL SUCCINATE ER 25 MG PO TB24: 25.0000 mg | ORAL_TABLET | Freq: Every day | ORAL | 0 refills | Status: DC

## 2016-06-10 NOTE — Progress Notes (Signed)
Pre visit review using our clinic review tool, if applicable. No additional management support is needed unless otherwise documented below in the visit note. 

## 2016-06-10 NOTE — Progress Notes (Signed)
OFFICE VISIT  06/10/2016   CC:  Chief Complaint  Patient presents with  . Follow-up    RCI, pt is fasting.    HPI:    Patient is a 61 y.o. Caucasian male who presents for 6 mo f/u HTN.  HTN: no home bp checks.  No HA's, dizziness, CP, or SOB.  He is compliant with his meds. No otc decongestants.  No coffee.  Drinks 3-4 Mt Dew's per day.  Says he felt some recent R flank pain radiating around side into groin---thinks he passed a stone b/c this is what if felt like before.  He had to take vicodin 5/325, only a couple pills today.  Asks for new rx of this to have on hand for future episodes.  He is no longer having any symptoms.  Past Medical History:  Diagnosis Date  . Atypical chest pain   . History of migraine headaches   . HTN (hypertension) dx'd about 2008  . IFG (impaired fasting glucose)    A1c 5.7% August 2017  . Nephrolithiasis    No active dz. (Dr. Vernie Ammonsttelin)  . 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 CARDIOVASCULAR STRESS TEST  07/2013   Normal EF, no ischemia, mild hypertensive response to exercise  . INGUINAL HERNIA REPAIR     right  . LITHOTRIPSY  around 2009  . LUMBAR DISC SURGERY  1994   L5/S1 Dr. Lonny Prudeoby    Outpatient Medications Prior to Visit  Medication Sig Dispense Refill  . amLODipine (NORVASC) 10 MG tablet TAKE ONE (1) TABLET BY MOUTH EVERY DAY 30 tablet 6  . aspirin 81 MG tablet Take 1 tablet (81 mg total) by mouth daily. 30 tablet 11  . cetirizine (ZYRTEC) 10 MG tablet Take 10 mg by mouth daily.    Marland Kitchen. ibuprofen (ADVIL,MOTRIN) 800 MG tablet TAKE ONE (1) TABLET EVERY 6 HOURS AS NEEDED 60 tablet 6  . irbesartan (AVAPRO) 300 MG tablet Take 1 tablet (300 mg total) by mouth daily. 30 tablet 6  . Multiple Vitamin (MULTIVITAMIN) tablet Take 1 tablet by mouth daily.    . nicotine polacrilex (NICORETTE) 2 MG gum Take 1 each (2 mg total) by mouth as needed for smoking cessation. 50 tablet 2   . HYDROcodone-acetaminophen (NORCO/VICODIN) 5-325 MG tablet Take 1-2 tablets by mouth every 6 (six) hours as needed. 20 tablet 0   No facility-administered medications prior to visit.     No Known Allergies  ROS As per HPI  PE: Blood pressure (!) 165/92, pulse 73, temperature 98.3 F (36.8 C), temperature source Oral, resp. rate 16, height 5' 9.5" (1.765 m), weight 210 lb (95.3 kg), SpO2 97 %. Gen: Alert, well appearing.  Patient is oriented to person, place, time, and situation. AFFECT: pleasant, lucid thought and speech. CV: RRR, no m/r/g.   LUNGS: CTA bilat, nonlabored resps, good aeration in all lung fields. EXT: no clubbing, cyanosis, or edema.   LABS:  Lab Results  Component Value Date   TSH 0.99 12/02/2015   Lab Results  Component Value Date   WBC 5.8 12/02/2015   HGB 15.0 12/02/2015   HCT 45.0 12/02/2015   MCV 91.5 12/02/2015   PLT 156.0 12/02/2015   Lab Results  Component Value Date   CREATININE 0.98 12/02/2015   BUN 15 12/02/2015   NA 144 12/02/2015   K 4.2 12/02/2015   CL 106 12/02/2015   CO2 30 12/02/2015  Lab Results  Component Value Date   ALT 19 12/02/2015   AST 15 12/02/2015   ALKPHOS 66 12/02/2015   BILITOT 0.6 12/02/2015   Lab Results  Component Value Date   CHOL 156 12/02/2015   Lab Results  Component Value Date   HDL 60.10 12/02/2015   Lab Results  Component Value Date   LDLCALC 86 12/02/2015   Lab Results  Component Value Date   TRIG 48.0 12/02/2015   Lab Results  Component Value Date   CHOLHDL 3 12/02/2015   Lab Results  Component Value Date   PSA 1.16 12/02/2015   PSA 1.10 11/18/2014   PSA 1.00 09/13/2013   Lab Results  Component Value Date   HGBA1C 5.7 12/03/2015    IMPRESSION AND PLAN:  1) Uncontrolled HTN: will add toprol xl 25mg  qd. Encouraged pt to get home bp cuff and check bp qd, bring numbers to next f/u in 2-3 weeks to review. Cut back on [place]. Dew. BMET today.  2) Nephrolithiasis, hx of.  He  reports passing a stone recently.  I did do a rx for #20 vicodin 5/325 for him to use prn kidney stone pain when he passes one.    An After Visit Summary was printed and given to the patient.  FOLLOW UP: Return for 2-3 wks f/u HTN.  Signed:  Santiago Bumpers, MD           06/10/2016

## 2016-08-03 ENCOUNTER — Ambulatory Visit: Payer: 59 | Admitting: Family Medicine

## 2016-08-13 ENCOUNTER — Other Ambulatory Visit: Payer: Self-pay | Admitting: Family Medicine

## 2016-08-13 NOTE — Telephone Encounter (Signed)
[facility].  RF request for irbesartan LOV: 06/10/16 Next ov: 08/16/16 Last written: 12/02/15 #30 w/ 6RF  RF request for metoprolol Last written: 06/10/16 #30 w/ 4VW

## 2016-08-16 ENCOUNTER — Encounter: Payer: Self-pay | Admitting: Family Medicine

## 2016-08-16 ENCOUNTER — Ambulatory Visit (INDEPENDENT_AMBULATORY_CARE_PROVIDER_SITE_OTHER): Payer: 59 | Admitting: Family Medicine

## 2016-08-16 VITALS — BP 143/80 | HR 68 | Temp 98.2°F | Resp 16 | Ht 69.5 in | Wt 210.5 lb

## 2016-08-16 DIAGNOSIS — I10 Essential (primary) hypertension: Secondary | ICD-10-CM

## 2016-08-16 MED ORDER — AMLODIPINE BESYLATE 10 MG PO TABS: ORAL_TABLET | ORAL | 6 refills | Status: DC

## 2016-08-16 NOTE — Progress Notes (Signed)
OFFICE VISIT  08/16/2016   CC:  Chief Complaint  Patient presents with  . Follow-up    HTN,    HPI:    Patient is a 61 y.o. Caucasian male who presents for 2 month f/u HTN. Last visit we added toprol xl  qd.  Home bp monitoring: avg low 130s/70s.  HR 60s-70s. No side effects from meds.  Denies HAs, dizziness, vision changes, CP, or SOB.   Past Medical History:  Diagnosis Date  . Atypical chest pain   . History of migraine headaches   . HTN (hypertension) dx'd about 2008  . IFG (impaired fasting glucose)    A1c 5.7% August 2017  . Nephrolithiasis    No active dz. (Dr. Vernie Ammons)  . 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 CARDIOVASCULAR STRESS TEST  07/2013   Normal EF, no ischemia, mild hypertensive response to exercise  . INGUINAL HERNIA REPAIR     right  . LITHOTRIPSY  around 2009  . LUMBAR DISC SURGERY  1994   L5/S1 Dr. Lonny Prude    Outpatient Medications Prior to Visit  Medication Sig Dispense Refill  . aspirin 81 MG tablet Take 1 tablet (81 mg total) by mouth daily. 30 tablet 11  . cetirizine (ZYRTEC) 10 MG tablet Take 10 mg by mouth daily.    Marland Kitchen HYDROcodone-acetaminophen (NORCO/VICODIN) 5-325 MG tablet Take 1-2 tablets by mouth every 6 (six) hours as needed. 20 tablet 0  . ibuprofen (ADVIL,MOTRIN) 800 MG tablet TAKE ONE (1) TABLET EVERY 6 HOURS AS NEEDED 60 tablet 6  . irbesartan (AVAPRO) 300 MG tablet TAKE ONE (1) TABLET BY MOUTH EVERY DAY 30 tablet 6  . metoprolol succinate (TOPROL-XL) 25 MG 24 hr tablet TAKE ONE (1) TABLET BY MOUTH EVERY DAY 30 tablet 6  . Multiple Vitamin (MULTIVITAMIN) tablet Take 1 tablet by mouth daily.    . nicotine polacrilex (NICORETTE) 2 MG gum Take 1 each (2 mg total) by mouth as needed for smoking cessation. 50 tablet 2  . amLODipine (NORVASC) 10 MG tablet TAKE ONE (1) TABLET BY MOUTH EVERY DAY 30 tablet 6   No facility-administered medications prior to visit.      No Known Allergies  ROS As per HPI  PE: Blood pressure (!) 143/80, pulse 68, temperature 98.2 F (36.8 C), temperature source Oral, resp. rate 16, height 5' 9.5" (1.765 m), weight 210 lb 8 oz (95.5 kg), SpO2 100 %. Gen: Alert, well appearing.  Patient is oriented to person, place, time, and situation. AFFECT: pleasant, lucid thought and speech. CV: RRR, no m/r/g.   LUNGS: CTA bilat, nonlabored resps, good aeration in all lung fields. EXT: no clubbing, cyanosis, or edema.    LABS:  Lab Results  Component Value Date   TSH 0.99 12/02/2015   Lab Results  Component Value Date   WBC 5.8 12/02/2015   HGB 15.0 12/02/2015   HCT 45.0 12/02/2015   MCV 91.5 12/02/2015   PLT 156.0 12/02/2015   Lab Results  Component Value Date   CREATININE 1.01 06/10/2016   BUN 18 06/10/2016   NA 142 06/10/2016   K 4.3 06/10/2016   CL 104 06/10/2016   CO2 33 (H) 06/10/2016   Lab Results  Component Value Date   ALT 19 12/02/2015   AST 15 12/02/2015   ALKPHOS 66 12/02/2015   BILITOT 0.6 12/02/2015   Lab Results  Component Value Date   CHOL  156 12/02/2015   Lab Results  Component Value Date   HDL 60.10 12/02/2015   Lab Results  Component Value Date   LDLCALC 86 12/02/2015   Lab Results  Component Value Date   TRIG 48.0 12/02/2015   Lab Results  Component Value Date   CHOLHDL 3 12/02/2015   Lab Results  Component Value Date   PSA 1.16 12/02/2015   PSA 1.10 11/18/2014   PSA 1.00 09/13/2013   Lab Results  Component Value Date   HGBA1C 5.7 12/03/2015   IMPRESSION AND PLAN:  HTN, now well controlled. The current medical regimen is effective;  continue present plan and medications.  An After Visit Summary was printed and given to the patient.  FOLLOW UP: Return in about 6 months (around 02/15/2017) for annual CPE (fasting).  Signed:  Santiago Bumpers, MD           08/16/2016

## 2016-08-16 NOTE — Progress Notes (Signed)
Pre visit review using our clinic review tool, if applicable. No additional management support is needed unless otherwise documented below in the visit note. 

## 2016-09-15 ENCOUNTER — Other Ambulatory Visit: Payer: Self-pay | Admitting: Family Medicine

## 2016-09-15 NOTE — Telephone Encounter (Signed)
[facility]  RF request for ibuprofen LOV: 06/10/16 Next ov: 12/02/16 Last written: 12/02/15 360 w/ 6RF  Please advise. Thanks.

## 2016-09-17 NOTE — Telephone Encounter (Signed)
Spoke to tech at [facility] they received Rx on 09/16/16.

## 2016-12-02 ENCOUNTER — Ambulatory Visit (INDEPENDENT_AMBULATORY_CARE_PROVIDER_SITE_OTHER): Payer: 59 | Admitting: Family Medicine

## 2016-12-02 ENCOUNTER — Encounter: Payer: Self-pay | Admitting: Family Medicine

## 2016-12-02 ENCOUNTER — Encounter: Payer: Self-pay | Admitting: *Deleted

## 2016-12-02 VITALS — BP 144/79 | HR 66 | Temp 97.9°F | Resp 16 | Ht 69.5 in | Wt 206.8 lb

## 2016-12-02 DIAGNOSIS — R7301 Impaired fasting glucose: Secondary | ICD-10-CM

## 2016-12-02 DIAGNOSIS — Z125 Encounter for screening for malignant neoplasm of prostate: Secondary | ICD-10-CM | POA: Diagnosis not present

## 2016-12-02 DIAGNOSIS — Z0000 Encounter for general adult medical examination without abnormal findings: Secondary | ICD-10-CM | POA: Diagnosis not present

## 2016-12-02 LAB — CBC WITH DIFFERENTIAL/PLATELET
BASOS ABS: 0 10*3/uL (ref 0.0–0.1)
Basophils Relative: 0.7 % (ref 0.0–3.0)
EOS ABS: 0 10*3/uL (ref 0.0–0.7)
Eosinophils Relative: 0.6 % (ref 0.0–5.0)
HCT: 46.4 % (ref 39.0–52.0)
HEMOGLOBIN: 15.5 g/dL (ref 13.0–17.0)
LYMPHS ABS: 1.2 10*3/uL (ref 0.7–4.0)
Lymphocytes Relative: 18.6 % (ref 12.0–46.0)
MCHC: 33.4 g/dL (ref 30.0–36.0)
MCV: 93.5 fl (ref 78.0–100.0)
MONO ABS: 0.5 10*3/uL (ref 0.1–1.0)
Monocytes Relative: 8.7 % (ref 3.0–12.0)
NEUTROS PCT: 71.4 % (ref 43.0–77.0)
Neutro Abs: 4.5 10*3/uL (ref 1.4–7.7)
Platelets: 158 10*3/uL (ref 150.0–400.0)
RBC: 4.96 Mil/uL (ref 4.22–5.81)
RDW: 12.7 % (ref 11.5–15.5)
WBC: 6.3 10*3/uL (ref 4.0–10.5)

## 2016-12-02 LAB — LIPID PANEL
CHOL/HDL RATIO: 3
Cholesterol: 161 mg/dL (ref 0–200)
HDL: 58.9 mg/dL (ref >39.00)
LDL Cholesterol: 90 mg/dL (ref 0–99)
NONHDL: 102.13
Triglycerides: 61 mg/dL (ref 0.0–149.0)
VLDL: 12.2 mg/dL (ref 0.0–40.0)

## 2016-12-02 LAB — COMPREHENSIVE METABOLIC PANEL
ALT: 18 U/L (ref 0–53)
AST: 15 U/L (ref 0–37)
Albumin: 4.6 g/dL (ref 3.5–5.2)
Alkaline Phosphatase: 56 U/L (ref 39–117)
BILIRUBIN TOTAL: 0.9 mg/dL (ref 0.2–1.2)
BUN: 13 mg/dL (ref 6–23)
CHLORIDE: 104 meq/L (ref 96–112)
CO2: 32 meq/L (ref 19–32)
Calcium: 9.6 mg/dL (ref 8.4–10.5)
Creatinine, Ser: 0.96 mg/dL (ref 0.40–1.50)
GFR: 85 mL/min (ref >60.00)
GLUCOSE: 112 mg/dL — AB (ref 70–99)
Potassium: 4.3 mEq/L (ref 3.5–5.1)
Sodium: 143 mEq/L (ref 135–145)
Total Protein: 7.2 g/dL (ref 6.0–8.3)

## 2016-12-02 LAB — HEMOGLOBIN A1C: HEMOGLOBIN A1C: 5.8 % (ref 4.6–6.5)

## 2016-12-02 LAB — TSH: TSH: 1 u[IU]/mL (ref 0.35–4.50)

## 2016-12-02 LAB — PSA: PSA: 1.24 ng/mL (ref 0.10–4.00)

## 2016-12-02 NOTE — Progress Notes (Addendum)
Office Note 12/02/2016  CC:  Chief Complaint  Patient presents with  . Annual Exam    Pt is fasting.   HPI:  Christopher Hunter is a 61 y.o. White male who is here for annual health maintenance exam. 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  BP monitoring at home only occasionally is 120s/70s.    Eye exam: this year, normal. Dental preventative exams UTD. Exercise: activity at work but no formal exercise regimen. Diet: working on cutting back on caffeine and increasing water intake.   Past Medical History:  Diagnosis Date  . Atypical chest pain   . History of migraine headaches   . HTN (hypertension) dx'd about 2008  . IFG (impaired fasting glucose)    A1c 5.7% August 2017  . Nephrolithiasis    No active dz. (Dr. Vernie Ammons)  . 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 CARDIOVASCULAR STRESS TEST  07/2013   Normal EF, no ischemia, mild hypertensive response to exercise  . INGUINAL HERNIA REPAIR     right  . LITHOTRIPSY  around 2009  . LUMBAR DISC SURGERY  1994   L5/S1 Dr. Lonny Prude    History reviewed. No pertinent family history.  Social History   Social History  . Marital status: Married    Spouse name: N/A  . Number of children: N/A  . Years of education: N/A   Occupational History  . Not on file.   Social History Main Topics  . Smoking status: Never Smoker  . Smokeless tobacco: Current User    Types: Snuff  . Alcohol use No  . Drug use: No  . Sexual activity: Not on file   Other Topics Concern  . Not on file   Social History Narrative   Married, one 25 yr old son.   Lives in [place], [place].  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   Never smoker, no ETOH, no drugs.          Outpatient Medications Prior to Visit  Medication Sig Dispense Refill  . amLODipine (NORVASC) 10 MG tablet TAKE ONE (1) TABLET BY MOUTH EVERY DAY 30 tablet 6  . aspirin 81 MG tablet Take 1  tablet (81 mg total) by mouth daily. 30 tablet 11  . cetirizine (ZYRTEC) 10 MG tablet Take 10 mg by mouth daily.    Marland Kitchen HYDROcodone-acetaminophen (NORCO/VICODIN) 5-325 MG tablet Take 1-2 tablets by mouth every 6 (six) hours as needed. 20 tablet 0  . ibuprofen (ADVIL,MOTRIN) 800 MG tablet Take 1 tablet (800 mg total) by mouth every 8 (eight) hours as needed. 180 tablet 1  . irbesartan (AVAPRO) 300 MG tablet TAKE ONE (1) TABLET BY MOUTH EVERY DAY 30 tablet 6  . metoprolol succinate (TOPROL-XL) 25 MG 24 hr tablet TAKE ONE (1) TABLET BY MOUTH EVERY DAY 30 tablet 6  . Multiple Vitamin (MULTIVITAMIN) tablet Take 1 tablet by mouth daily.    . nicotine polacrilex (NICORETTE) 2 MG gum Take 1 each (2 mg total) by mouth as needed for smoking cessation. 50 tablet 2   No facility-administered medications prior to visit.     No Known Allergies  ROS Review of Systems  Constitutional: Negative for appetite change, chills, fatigue and fever.  HENT: Negative for congestion, dental problem, ear pain and sore throat.   Eyes: Negative for discharge, redness and visual disturbance.  Respiratory: Negative for cough, chest tightness, shortness of breath and  wheezing.   Cardiovascular: Negative for chest pain, palpitations and leg swelling.  Gastrointestinal: Negative for abdominal pain, blood in stool, diarrhea, nausea and vomiting.  Genitourinary: Negative for difficulty urinating, dysuria, flank pain, frequency, hematuria and urgency.  Musculoskeletal: Negative for arthralgias, back pain, joint swelling, myalgias and neck stiffness.  Skin: Negative for pallor and rash.  Neurological: Negative for dizziness, speech difficulty, weakness and headaches.  Hematological: Negative for adenopathy. Does not bruise/bleed easily.  Psychiatric/Behavioral: Negative for confusion and sleep disturbance. The patient is not nervous/anxious.     PE; Blood pressure (!) 144/79, pulse 66, temperature 97.9 F (36.6 C),  temperature source Oral, resp. rate 16, height 5' 9.5" (1.765 m), weight 206 lb 12 oz (93.8 kg), SpO2 100 %. Gen: Alert, well appearing.  Patient is oriented to person, place, time, and situation. AFFECT: pleasant, lucid thought and speech. ENT: Ears: EACs clear, normal epithelium.  TMs with good light reflex and landmarks bilaterally.  Eyes: no injection, icteris, swelling, or exudate.  EOMI, PERRLA. Nose: no drainage or turbinate edema/swelling.  No injection or focal lesion.  Mouth: lips without lesion/swelling.  Oral mucosa pink and moist.  Dentition intact and without obvious caries or gingival swelling.  Oropharynx without erythema, exudate, or swelling.  Neck: supple/nontender.  No LAD, mass, or TM.  Carotid pulses 2+ bilaterally, without bruits. CV: RRR, no m/r/g.   LUNGS: CTA bilat, nonlabored resps, good aeration in all lung fields. ABD: soft, NT, ND, BS normal.  No hepatospenomegaly or mass.  No bruits. EXT: no clubbing, cyanosis, or edema.  Musculoskeletal: no joint swelling, erythema, warmth, or tenderness.  ROM of all joints intact. Skin - no sores or suspicious lesions or rashes or color changes Rectal exam: negative without mass, lesions or tenderness, PROSTATE EXAM: smooth and symmetric without nodules or tenderness.   Pertinent labs:  Lab Results  Component Value Date   TSH 0.99 12/02/2015   Lab Results  Component Value Date   WBC 5.8 12/02/2015   HGB 15.0 12/02/2015   HCT 45.0 12/02/2015   MCV 91.5 12/02/2015   PLT 156.0 12/02/2015   Lab Results  Component Value Date   CREATININE 1.01 06/10/2016   BUN 18 06/10/2016   NA 142 06/10/2016   K 4.3 06/10/2016   CL 104 06/10/2016   CO2 33 (H) 06/10/2016   Lab Results  Component Value Date   ALT 19 12/02/2015   AST 15 12/02/2015   ALKPHOS 66 12/02/2015   BILITOT 0.6 12/02/2015   Lab Results  Component Value Date   CHOL 156 12/02/2015   Lab Results  Component Value Date   HDL 60.10 12/02/2015   Lab  Results  Component Value Date   LDLCALC 86 12/02/2015   Lab Results  Component Value Date   TRIG 48.0 12/02/2015   Lab Results  Component Value Date   CHOLHDL 3 12/02/2015   Lab Results  Component Value Date   PSA 1.16 12/02/2015   PSA 1.10 11/18/2014   PSA 1.00 09/13/2013   Lab Results  Component Value Date   HGBA1C 5.7 12/03/2015    ASSESSMENT AND PLAN:   Health maintenance exam: Reviewed age and gender appropriate health maintenance issues (prudent diet, regular exercise, health risks of tobacco and excessive alcohol, use of seatbelts, fire alarms in home, use of sunscreen).  Also reviewed age and gender appropriate health screening as well as vaccine recommendations. Vaccines: UTD. Labs: fasting HP, A1c (hx of IFG), and PSA today. Prostate ca screening:  DRE  normal today, PSA today. Colon ca screening: he has repeatedly declined colonoscopy in the past.  He is agreeable to this now, but wants to try to set this up himself with a GI MD in [place] that his parents have gone to.  He'll call if he requires primary care referral.  An After Visit Summary was printed and given to the patient.  FOLLOW UP:  Return in about 6 months (around 06/04/2017) for routine chronic illness f/u.  Signed:  Santiago BumpersPhil Raejean Swinford, MD           12/02/2016

## 2016-12-02 NOTE — Patient Instructions (Signed)

## 2017-03-18 ENCOUNTER — Other Ambulatory Visit: Payer: Self-pay | Admitting: Family Medicine

## 2017-05-03 DIAGNOSIS — K4090 Unilateral inguinal hernia, without obstruction or gangrene, not specified as recurrent: Secondary | ICD-10-CM

## 2017-05-03 HISTORY — DX: Unilateral inguinal hernia, without obstruction or gangrene, not specified as recurrent: K40.90

## 2017-06-02 NOTE — Progress Notes (Signed)
OFFICE VISIT  06/03/2017   CC:  Chief Complaint  Patient presents with  . Follow-up    RCI, pt is not fasting.    HPI:    Patient is a 62 y.o. Caucasian male who presents for f/u HTN.  HTN: home monitoring 130-140 over 70s.  Occ reading 180s/80.  HR 70s usually. 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  No HAs, no dizziness, no palpitations, no CP, no SOB.  Past Medical History:  Diagnosis Date  . Atypical chest pain   . History of migraine headaches   . HTN (hypertension) dx'd about 2008   with white coat component  . IFG (impaired fasting glucose)    A1c 5.7% August 2017  . Nephrolithiasis    No active dz. (Dr. Vernie Ammons)  . 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 CARDIOVASCULAR STRESS TEST  07/2013   Normal EF, no ischemia, mild hypertensive response to exercise  . INGUINAL HERNIA REPAIR     right  . LITHOTRIPSY  around 2009  . LUMBAR DISC SURGERY  1994   L5/S1 Dr. Lonny Prude    Outpatient Medications Prior to Visit  Medication Sig Dispense Refill  . amLODipine (NORVASC) 10 MG tablet TAKE ONE (1) TABLET BY MOUTH EVERY DAY 30 tablet 6  . aspirin 81 MG tablet Take 1 tablet (81 mg total) by mouth daily. 30 tablet 11  . cetirizine (ZYRTEC) 10 MG tablet Take 10 mg by mouth daily.    Marland Kitchen HYDROcodone-acetaminophen (NORCO/VICODIN) 5-325 MG tablet Take 1-2 tablets by mouth every 6 (six) hours as needed. 20 tablet 0  . ibuprofen (ADVIL,MOTRIN) 800 MG tablet Take 1 tablet (800 mg total) by mouth every 8 (eight) hours as needed. 180 tablet 1  . irbesartan (AVAPRO) 300 MG tablet TAKE ONE (1) TABLET BY MOUTH EVERY DAY 30 tablet 5  . Multiple Vitamin (MULTIVITAMIN) tablet Take 1 tablet by mouth daily.    . metoprolol succinate (TOPROL-XL) 25 MG 24 hr tablet TAKE ONE (1) TABLET BY MOUTH EVERY DAY 30 tablet 5  . nicotine polacrilex (NICORETTE) 2 MG gum Take 1 each (2 mg total) by mouth as needed for smoking cessation.  (Patient not taking: Reported on 06/03/2017) 50 tablet 2   No facility-administered medications prior to visit.     No Known Allergies  ROS As per HPI  PE: Blood pressure (!) 150/72, pulse 74, temperature 98.3 F (36.8 C), temperature source Oral, resp. rate 16, height 5' 9.5" (1.765 m), weight 209 lb (94.8 kg), SpO2 99 %. Gen: Alert, well appearing.  Patient is oriented to person, place, time, and situation. AFFECT: pleasant, lucid thought and speech. CV: RRR, no m/r/g.   LUNGS: CTA bilat, nonlabored resps, good aeration in all lung fields. EXT: no clubbing, cyanosis, or edema.    LABS:  Lab Results  Component Value Date   TSH 1.00 12/02/2016   Lab Results  Component Value Date   WBC 6.3 12/02/2016   HGB 15.5 12/02/2016   HCT 46.4 12/02/2016   MCV 93.5 12/02/2016   PLT 158.0 12/02/2016   Lab Results  Component Value Date   CREATININE 0.96 12/02/2016   BUN 13 12/02/2016   NA 143 12/02/2016   K 4.3 12/02/2016   CL 104 12/02/2016   CO2 32 12/02/2016   Lab Results  Component Value Date   ALT 18 12/02/2016   AST 15 12/02/2016   ALKPHOS 56 12/02/2016  BILITOT 0.9 12/02/2016   Lab Results  Component Value Date   CHOL 161 12/02/2016   Lab Results  Component Value Date   HDL 58.90 12/02/2016   Lab Results  Component Value Date   LDLCALC 90 12/02/2016   Lab Results  Component Value Date   TRIG 61.0 12/02/2016   Lab Results  Component Value Date   CHOLHDL 3 12/02/2016   Lab Results  Component Value Date   PSA 1.24 12/02/2016   PSA 1.16 12/02/2015   PSA 1.10 11/18/2014   Lab Results  Component Value Date   HGBA1C 5.8 12/02/2016    IMPRESSION AND PLAN:  Uncontrolled HTN: increase toprol xl to 50mg  qd.  Continue remainder of bp med regimen as-is. Continue to monitor bp and HR at home at least three times a week and bring numbers in for review again in 2 mo.  An After Visit Summary was printed and given to the patient.   FOLLOW UP: Return in  about 2 months (around 08/01/2017) for f/u HTN.  Signed:  Santiago BumpersPhil McGowen, MD           06/03/2017

## 2017-06-03 ENCOUNTER — Ambulatory Visit: Payer: 59 | Admitting: Family Medicine

## 2017-06-03 ENCOUNTER — Encounter: Payer: Self-pay | Admitting: Family Medicine

## 2017-06-03 VITALS — BP 150/72 | HR 74 | Temp 98.3°F | Resp 16 | Ht 69.5 in | Wt 209.0 lb

## 2017-06-03 DIAGNOSIS — I10 Essential (primary) hypertension: Secondary | ICD-10-CM

## 2017-06-03 MED ORDER — METOPROLOL SUCCINATE ER 50 MG PO TB24: 50.0000 mg | ORAL_TABLET | Freq: Every day | ORAL | 2 refills | Status: DC

## 2017-06-13 ENCOUNTER — Other Ambulatory Visit: Payer: Self-pay | Admitting: Family Medicine

## 2017-08-06 ENCOUNTER — Other Ambulatory Visit: Payer: Self-pay | Admitting: Family Medicine

## 2017-08-08 NOTE — Telephone Encounter (Signed)
[facility]  RF request for IBP LOV: 06/03/17 Next ov:  08/15/17 Last written: 09/15/16 #180 w/ 1RF  Please advise. Thanks.

## 2017-08-15 ENCOUNTER — Ambulatory Visit: Payer: 59 | Admitting: Family Medicine

## 2017-08-15 ENCOUNTER — Encounter: Payer: Self-pay | Admitting: Family Medicine

## 2017-08-15 VITALS — BP 123/75 | HR 65 | Resp 16 | Wt 204.0 lb

## 2017-08-15 DIAGNOSIS — I10 Essential (primary) hypertension: Secondary | ICD-10-CM | POA: Diagnosis not present

## 2017-08-15 MED ORDER — METOPROLOL SUCCINATE ER 50 MG PO TB24: 50.0000 mg | ORAL_TABLET | Freq: Every day | ORAL | 3 refills | Status: DC

## 2017-08-15 MED ORDER — HYDROCODONE-ACETAMINOPHEN 5-325 MG PO TABS: 1.0000 | ORAL_TABLET | Freq: Four times a day (QID) | ORAL | 0 refills | Status: DC | PRN

## 2017-08-15 NOTE — Progress Notes (Signed)
OFFICE VISIT  08/15/2017   CC:  Chief Complaint  Patient presents with  . Hypertension    follow up    HPI:    Patient is a 62 y.o. Caucasian male who presents for 2 and 1/2 month f/u uncontrolled HTN. Last visit I increased his toprol xl to 50mg  daily.  BPs: 120s/70s avg and HR avg 60s since last visit.  Felt better soon after taking the increased dose of toprol xl.  Takes 800 mg ibup  qAM most days---no additional doses later in the day. Has RARE need for vicodin for acute kidney stone pain or low back pain: most recent rx for this med was 06/2016 for #20 tabs.  His rx is out of date so he asks for a new rx.  He has stopped his ASA b/c it was causing too much forearm bruising: bruising gone now that he is off of it. He also had heard the recent research results regarding change in thought about ASA for primary CV prevention--even in pt's with multiple RF's.   Past Medical History:  Diagnosis Date  . Atypical chest pain   . History of migraine headaches   . HTN (hypertension) dx'd about 2008   with white coat component  . IFG (impaired fasting glucose)    A1c 5.7% August 2017  . Nephrolithiasis    No active dz. (Dr. Vernie Ammonsttelin)  . 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 CARDIOVASCULAR STRESS TEST  07/2013   Normal EF, no ischemia, mild hypertensive response to exercise  . INGUINAL HERNIA REPAIR     right  . LITHOTRIPSY  around 2009  . LUMBAR DISC SURGERY  1994   L5/S1 Dr. Lonny Prudeoby    Outpatient Medications Prior to Visit  Medication Sig Dispense Refill  . amLODipine (NORVASC) 10 MG tablet TAKE ONE (1) TABLET EACH DAY 30 tablet 5  . cetirizine (ZYRTEC) 10 MG tablet Take 10 mg by mouth daily.    Marland Kitchen. ibuprofen (ADVIL,MOTRIN) 800 MG tablet TAKE ONE TABLET BY MOUTH EVERY EIGHT HOURS AS NEEDED 180 tablet 1  . irbesartan (AVAPRO) 300 MG tablet TAKE ONE (1) TABLET BY MOUTH EVERY DAY 30 tablet 5  . Multiple  Vitamin (MULTIVITAMIN) tablet Take 1 tablet by mouth daily.    Marland Kitchen. HYDROcodone-acetaminophen (NORCO/VICODIN) 5-325 MG tablet Take 1-2 tablets by mouth every 6 (six) hours as needed. 20 tablet 0  . metoprolol succinate (TOPROL-XL) 50 MG 24 hr tablet Take 1 tablet (50 mg total) by mouth daily. Take with or immediately following a meal. 30 tablet 2  . aspirin 81 MG tablet Take 1 tablet (81 mg total) by mouth daily. (Patient not taking: Reported on 08/15/2017) 30 tablet 11   No facility-administered medications prior to visit.     No Known Allergies  ROS As per HPI  PE: Blood pressure 123/75, pulse 65, resp. rate 16, weight 204 lb (92.5 kg), SpO2 99 %. Gen: Alert, well appearing.  Patient is oriented to person, place, time, and situation. AFFECT: pleasant, lucid thought and speech. CV: RRR, no m/r/g.   LUNGS: CTA bilat, nonlabored resps, good aeration in all lung fields. EXT: no clubbing, cyanosis, or edema.    LABS:    Chemistry      Component Value Date/Time   NA 143 12/02/2016 0914   K 4.3 12/02/2016 0914   CL 104 12/02/2016 0914   CO2 32 12/02/2016 0914   BUN  13 12/02/2016 0914   CREATININE 0.96 12/02/2016 0914      Component Value Date/Time   CALCIUM 9.6 12/02/2016 0914   ALKPHOS 56 12/02/2016 0914   AST 15 12/02/2016 0914   ALT 18 12/02/2016 0914   BILITOT 0.9 12/02/2016 0914      IMPRESSION AND PLAN:  1) HTN: now well controlled on current regimen. No changes today.  No labs due today. I agree with pt stopping his ASA--no signif benefit for primary CV/CVA prevention for him.  2) Episodic LBP and recurrent nephrolithiasis: he takes occ dose of vicodin---he has not even gone through #20 of the 5/325mg  tabs in the last 14 months.  I gave pt new rx today for #20 of the vicodin 5/325 to use 1-2 q6h prn. He'll continue to use this sparingly.  An After Visit Summary was printed and given to the patient.  FOLLOW UP: Return in about 4 months (around 12/15/2017) for annual  CPE (fasting).  Signed:  Santiago Bumpers, MD           08/15/2017

## 2017-09-09 ENCOUNTER — Other Ambulatory Visit: Payer: Self-pay | Admitting: Family Medicine

## 2017-11-02 ENCOUNTER — Encounter (INDEPENDENT_AMBULATORY_CARE_PROVIDER_SITE_OTHER): Payer: 59 | Admitting: Ophthalmology

## 2017-11-02 DIAGNOSIS — H2513 Age-related nuclear cataract, bilateral: Secondary | ICD-10-CM

## 2017-11-02 DIAGNOSIS — H35033 Hypertensive retinopathy, bilateral: Secondary | ICD-10-CM

## 2017-11-02 DIAGNOSIS — H43813 Vitreous degeneration, bilateral: Secondary | ICD-10-CM

## 2017-11-02 DIAGNOSIS — I10 Essential (primary) hypertension: Secondary | ICD-10-CM | POA: Diagnosis not present

## 2017-12-08 ENCOUNTER — Ambulatory Visit (INDEPENDENT_AMBULATORY_CARE_PROVIDER_SITE_OTHER): Payer: 59 | Admitting: Family Medicine

## 2017-12-08 ENCOUNTER — Encounter: Payer: Self-pay | Admitting: Family Medicine

## 2017-12-08 VITALS — BP 124/82 | HR 74 | Temp 98.2°F | Resp 16 | Ht 69.5 in | Wt 204.0 lb

## 2017-12-08 DIAGNOSIS — Z125 Encounter for screening for malignant neoplasm of prostate: Secondary | ICD-10-CM

## 2017-12-08 DIAGNOSIS — I10 Essential (primary) hypertension: Secondary | ICD-10-CM

## 2017-12-08 DIAGNOSIS — R7301 Impaired fasting glucose: Secondary | ICD-10-CM | POA: Diagnosis not present

## 2017-12-08 DIAGNOSIS — Z0000 Encounter for general adult medical examination without abnormal findings: Secondary | ICD-10-CM

## 2017-12-08 LAB — COMPREHENSIVE METABOLIC PANEL
ALK PHOS: 58 U/L (ref 39–117)
ALT: 17 U/L (ref 0–53)
AST: 16 U/L (ref 0–37)
Albumin: 4.5 g/dL (ref 3.5–5.2)
BUN: 16 mg/dL (ref 6–23)
CHLORIDE: 104 meq/L (ref 96–112)
CO2: 31 mEq/L (ref 19–32)
Calcium: 9.9 mg/dL (ref 8.4–10.5)
Creatinine, Ser: 0.97 mg/dL (ref 0.40–1.50)
GFR: 83.71 mL/min (ref >60.00)
GLUCOSE: 101 mg/dL — AB (ref 70–99)
Potassium: 4.4 mEq/L (ref 3.5–5.1)
SODIUM: 142 meq/L (ref 135–145)
TOTAL PROTEIN: 7.2 g/dL (ref 6.0–8.3)
Total Bilirubin: 1.2 mg/dL (ref 0.2–1.2)

## 2017-12-08 LAB — CBC WITH DIFFERENTIAL/PLATELET
BASOS ABS: 0.1 10*3/uL (ref 0.0–0.1)
Basophils Relative: 1.6 % (ref 0.0–3.0)
Eosinophils Absolute: 0.1 10*3/uL (ref 0.0–0.7)
Eosinophils Relative: 1.4 % (ref 0.0–5.0)
HCT: 46 % (ref 39.0–52.0)
Hemoglobin: 15.5 g/dL (ref 13.0–17.0)
Lymphocytes Relative: 22.8 % (ref 12.0–46.0)
Lymphs Abs: 1.2 10*3/uL (ref 0.7–4.0)
MCHC: 33.7 g/dL (ref 30.0–36.0)
MCV: 92.5 fl (ref 78.0–100.0)
MONOS PCT: 10 % (ref 3.0–12.0)
Monocytes Absolute: 0.5 10*3/uL (ref 0.1–1.0)
Neutro Abs: 3.3 10*3/uL (ref 1.4–7.7)
Neutrophils Relative %: 64.2 % (ref 43.0–77.0)
PLATELETS: 166 10*3/uL (ref 150.0–400.0)
RBC: 4.98 Mil/uL (ref 4.22–5.81)
RDW: 13 % (ref 11.5–15.5)
WBC: 5.2 10*3/uL (ref 4.0–10.5)

## 2017-12-08 LAB — LIPID PANEL
Cholesterol: 169 mg/dL (ref 0–200)
HDL: 55.6 mg/dL (ref >39.00)
LDL Cholesterol: 96 mg/dL (ref 0–99)
NONHDL: 113.45
Total CHOL/HDL Ratio: 3
Triglycerides: 85 mg/dL (ref 0.0–149.0)
VLDL: 17 mg/dL (ref 0.0–40.0)

## 2017-12-08 LAB — HEMOGLOBIN A1C: Hgb A1c MFr Bld: 5.8 % (ref 4.6–6.5)

## 2017-12-08 LAB — PSA: PSA: 1.28 ng/mL (ref 0.10–4.00)

## 2017-12-08 NOTE — Patient Instructions (Signed)

## 2017-12-08 NOTE — Progress Notes (Signed)
Office Note 12/08/2017  CC:  Chief Complaint  Patient presents with  . Annual Exam    HPI:  Christopher Hunter is a 62 y.o. White male who is here for annual health maintenance exam.  Exercise: none except lots of activity at work. Diet: cut out all caffeine, otherwise not working on anything.  Past Medical History:  Diagnosis Date  . Atypical chest pain   . History of migraine headaches   . HTN (hypertension) dx'd about 2008   with white coat component  . IFG (impaired fasting glucose)    A1c 5.7% August 2017  . Low back pain    episodic---RARE use of vicodin  . Nephrolithiasis    No active dz. (Dr. Vernie Ammons)  . 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 CARDIOVASCULAR STRESS TEST  07/2013   Normal EF, no ischemia, mild hypertensive response to exercise  . INGUINAL HERNIA REPAIR     right  . LITHOTRIPSY  around 2009  . LUMBAR DISC SURGERY  1994   L5/S1 Dr. Lonny Prude    History reviewed. No pertinent family history.  Social History   Socioeconomic History  . Marital status: Married    Spouse name: Not on file  . Number of children: Not on file  . Years of education: Not on file  . Highest education level: Not on file  Occupational History  . Not on file  Social Needs  . Financial resource strain: Not on file  . Food insecurity:    Worry: Not on file    Inability: Not on file  . Transportation needs:    Medical: Not on file    Non-medical: Not on file  Tobacco Use  . Smoking status: Never Smoker  . Smokeless tobacco: Current User    Types: Snuff  Substance and Sexual Activity  . Alcohol use: No  . Drug use: No  . Sexual activity: Not on file  Lifestyle  . Physical activity:    Days per week: Not on file    Minutes per session: Not on file  . Stress: Not on file  Relationships  . Social connections:    Talks on phone: Not on file    Gets together: Not on file    Attends religious  service: Not on file    Active member of club or organization: Not on file    Attends meetings of clubs or organizations: Not on file    Relationship status: Not on file  . Intimate partner violence:    Fear of current or ex partner: Not on file    Emotionally abused: Not on file    Physically abused: Not on file    Forced sexual activity: Not on file  Other Topics Concern  . Not on file  Social History Narrative   Married, one 51 yr old son.   Lives in [place], [place].  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   Never smoker, no ETOH, no drugs.       Outpatient Medications Prior to Visit  Medication Sig Dispense Refill  . amLODipine (NORVASC) 10 MG tablet TAKE ONE (1) TABLET EACH DAY 30 tablet 5  . cetirizine (ZYRTEC) 10 MG tablet Take 10 mg by mouth daily.    Marland Kitchen ibuprofen (ADVIL,MOTRIN) 800 MG tablet TAKE ONE TABLET BY MOUTH EVERY EIGHT HOURS AS NEEDED 180 tablet 1  . irbesartan (AVAPRO) 300 MG tablet TAKE ONE (1)  TABLET BY MOUTH EVERY DAY 30 tablet 5  . metoprolol succinate (TOPROL-XL) 50 MG 24 hr tablet Take 1 tablet (50 mg total) by mouth daily. Take with or immediately following a meal. 90 tablet 3  . Multiple Vitamin (MULTIVITAMIN) tablet Take 1 tablet by mouth daily.    Marland Kitchen HYDROcodone-acetaminophen (NORCO/VICODIN) 5-325 MG tablet Take 1-2 tablets by mouth every 6 (six) hours as needed. (Patient not taking: Reported on 12/08/2017) 20 tablet 0   No facility-administered medications prior to visit.     No Known Allergies  ROS Review of Systems  Constitutional: Negative for appetite change, chills, fatigue and fever.  HENT: Negative for congestion, dental problem, ear pain and sore throat.   Eyes: Negative for discharge, redness and visual disturbance.  Respiratory: Negative for cough, chest tightness, shortness of breath and wheezing.   Cardiovascular: Negative for chest pain, palpitations and leg swelling.  Gastrointestinal: Negative for abdominal pain, blood in  stool, diarrhea, nausea and vomiting.  Genitourinary: Negative for difficulty urinating, dysuria, flank pain, frequency, hematuria and urgency.  Musculoskeletal: Negative for arthralgias, back pain, joint swelling, myalgias and neck stiffness.  Skin: Negative for pallor and rash.  Neurological: Negative for dizziness, speech difficulty, weakness and headaches.  Hematological: Negative for adenopathy. Does not bruise/bleed easily.  Psychiatric/Behavioral: Negative for confusion and sleep disturbance. The patient is not nervous/anxious.     PE; Blood pressure 124/82, pulse 74, temperature 98.2 F (36.8 C), temperature source Oral, resp. rate 16, height 5' 9.5" (1.765 m), weight 204 lb (92.5 kg), SpO2 97 %. Body mass index is 29.69 kg/m.  Gen: Alert, well appearing.  Patient is oriented to person, place, time, and situation. AFFECT: pleasant, lucid thought and speech. ENT: Ears: EACs clear, normal epithelium.  TMs with good light reflex and landmarks bilaterally.  Eyes: no injection, icteris, swelling, or exudate.  EOMI, PERRLA. Nose: no drainage or turbinate edema/swelling.  No injection or focal lesion.  Mouth: lips without lesion/swelling.  Oral mucosa pink and moist.  Dentition intact and without obvious caries or gingival swelling.  Oropharynx without erythema, exudate, or swelling.  Neck: supple/nontender.  No LAD, mass, or TM.  Carotid pulses 2+ bilaterally, without bruits. CV: RRR, no m/r/g.   LUNGS: CTA bilat, nonlabored resps, good aeration in all lung fields. ABD: soft, NT, ND, BS normal.  No hepatospenomegaly or mass.  No bruits. EXT: no clubbing, cyanosis, or edema.  Musculoskeletal: no joint swelling, erythema, warmth, or tenderness.  ROM of all joints intact. Skin - no sores or suspicious lesions or rashes or color changes Rectal exam: negative without mass, lesions or tenderness, PROSTATE EXAM: smooth and symmetric without nodules or tenderness.   Pertinent labs:  Lab  Results  Component Value Date   TSH 1.00 12/02/2016   Lab Results  Component Value Date   WBC 6.3 12/02/2016   HGB 15.5 12/02/2016   HCT 46.4 12/02/2016   MCV 93.5 12/02/2016   PLT 158.0 12/02/2016   Lab Results  Component Value Date   CREATININE 0.96 12/02/2016   BUN 13 12/02/2016   NA 143 12/02/2016   K 4.3 12/02/2016   CL 104 12/02/2016   CO2 32 12/02/2016   Lab Results  Component Value Date   ALT 18 12/02/2016   AST 15 12/02/2016   ALKPHOS 56 12/02/2016   BILITOT 0.9 12/02/2016   Lab Results  Component Value Date   CHOL 161 12/02/2016   Lab Results  Component Value Date   HDL 58.90 12/02/2016  Lab Results  Component Value Date   LDLCALC 90 12/02/2016   Lab Results  Component Value Date   TRIG 61.0 12/02/2016   Lab Results  Component Value Date   CHOLHDL 3 12/02/2016   Lab Results  Component Value Date   PSA 1.24 12/02/2016   PSA 1.16 12/02/2015   PSA 1.10 11/18/2014   Lab Results  Component Value Date   HGBA1C 5.8 12/02/2016    ASSESSMENT AND PLAN:   Health maintenance exam: Reviewed age and gender appropriate health maintenance issues (prudent diet, regular exercise, health risks of tobacco and excessive alcohol, use of seatbelts, fire alarms in home, use of sunscreen).  Also reviewed age and gender appropriate health screening as well as vaccine recommendations. Vaccines: UTD. Labs: fasting lipids, CMET, CBC, PSA, HbA1c (IFG). Prostate ca screening: DRE normal today, PSA. Colon ca screening: Colonoscopy--> pt hasn't gotten this yet, still plans [place] get [place] rest of his family has gone.  An After Visit Summary was printed and given to the patient.  FOLLOW UP:  Return in about 6 months (around 06/10/2018) for routine chronic illness f/u.  Signed:  Santiago BumpersPhil Montez Cuda, MD           12/08/2017

## 2017-12-09 ENCOUNTER — Other Ambulatory Visit: Payer: Self-pay | Admitting: Family Medicine

## 2017-12-23 ENCOUNTER — Emergency Department (HOSPITAL_BASED_OUTPATIENT_CLINIC_OR_DEPARTMENT_OTHER)
Admission: EM | Admit: 2017-12-23 | Discharge: 2017-12-23 | Disposition: A | Discharge status: Home / Self Care | Payer: 59 | Attending: Emergency Medicine | Admitting: Emergency Medicine

## 2017-12-23 ENCOUNTER — Other Ambulatory Visit: Payer: Self-pay

## 2017-12-23 ENCOUNTER — Emergency Department (HOSPITAL_COMMUNITY)
Admission: EM | Admit: 2017-12-23 | Discharge: 2017-12-23 | Discharge status: Absent without leave | Payer: 59 | Source: Home / Self Care

## 2017-12-23 ENCOUNTER — Emergency Department (HOSPITAL_BASED_OUTPATIENT_CLINIC_OR_DEPARTMENT_OTHER): Payer: 59

## 2017-12-23 ENCOUNTER — Encounter (HOSPITAL_BASED_OUTPATIENT_CLINIC_OR_DEPARTMENT_OTHER): Payer: Self-pay | Admitting: *Deleted

## 2017-12-23 DIAGNOSIS — I10 Essential (primary) hypertension: Secondary | ICD-10-CM | POA: Insufficient documentation

## 2017-12-23 DIAGNOSIS — N200 Calculus of kidney: Secondary | ICD-10-CM | POA: Diagnosis not present

## 2017-12-23 DIAGNOSIS — R109 Unspecified abdominal pain: Secondary | ICD-10-CM | POA: Diagnosis present

## 2017-12-23 DIAGNOSIS — Z79899 Other long term (current) drug therapy: Secondary | ICD-10-CM | POA: Diagnosis not present

## 2017-12-23 DIAGNOSIS — R112 Nausea with vomiting, unspecified: Secondary | ICD-10-CM | POA: Insufficient documentation

## 2017-12-23 DIAGNOSIS — F17228 Nicotine dependence, chewing tobacco, with other nicotine-induced disorders: Secondary | ICD-10-CM | POA: Insufficient documentation

## 2017-12-23 DIAGNOSIS — M549 Dorsalgia, unspecified: Secondary | ICD-10-CM | POA: Insufficient documentation

## 2017-12-23 LAB — URINALYSIS, COMPLETE (UACMP) WITH MICROSCOPIC
BILIRUBIN URINE: NEGATIVE
GLUCOSE, UA: NEGATIVE mg/dL
Ketones, ur: 15 mg/dL — AB
Leukocytes, UA: NEGATIVE
NITRITE: NEGATIVE
PH: 6.5 (ref 5.0–8.0)
Protein, ur: NEGATIVE mg/dL
RBC / HPF: >50 RBC/hpf (ref 0–5)
SPECIFIC GRAVITY, URINE: 1.01 (ref 1.005–1.030)

## 2017-12-23 LAB — BASIC METABOLIC PANEL
Anion gap: 9 (ref 5–15)
BUN: 22 mg/dL — AB (ref 6–20)
CALCIUM: 9.2 mg/dL (ref 8.9–10.3)
CHLORIDE: 101 mmol/L (ref 98–111)
CO2: 29 mmol/L (ref 22–32)
CREATININE: 1.19 mg/dL (ref 0.61–1.24)
GFR calc non Af Amer: >60 mL/min (ref >60)
GLUCOSE: 100 mg/dL — AB (ref 70–99)
Potassium: 3.5 mmol/L (ref 3.5–5.1)
Sodium: 139 mmol/L (ref 135–145)

## 2017-12-23 LAB — CBC WITH DIFFERENTIAL/PLATELET
BASOS PCT: 0 %
Basophils Absolute: 0 10*3/uL (ref 0.0–0.1)
Eosinophils Absolute: 0 10*3/uL (ref 0.0–0.7)
Eosinophils Relative: 0 %
HEMATOCRIT: 43.4 % (ref 39.0–52.0)
Hemoglobin: 15.2 g/dL (ref 13.0–17.0)
LYMPHS ABS: 1.1 10*3/uL (ref 0.7–4.0)
Lymphocytes Relative: 9 %
MCH: 31.1 pg (ref 26.0–34.0)
MCHC: 35 g/dL (ref 30.0–36.0)
MCV: 88.9 fL (ref 78.0–100.0)
MONO ABS: 1 10*3/uL (ref 0.1–1.0)
MONOS PCT: 8 %
NEUTROS ABS: 10.2 10*3/uL — AB (ref 1.7–7.7)
Neutrophils Relative %: 83 %
Platelets: 156 10*3/uL (ref 150–400)
RBC: 4.88 MIL/uL (ref 4.22–5.81)
RDW: 12.2 % (ref 11.5–15.5)
WBC: 12.4 10*3/uL — ABNORMAL HIGH (ref 4.0–10.5)

## 2017-12-23 MED ORDER — ONDANSETRON HCL 4 MG/2ML IJ SOLN
4.0000 mg | Freq: Once | INTRAMUSCULAR | Status: AC
  Administered 2017-12-23: 4 mg via INTRAVENOUS
  Filled 2017-12-23: qty 2

## 2017-12-23 MED ORDER — MORPHINE SULFATE (PF) 4 MG/ML IV SOLN
4.0000 mg | Freq: Once | INTRAVENOUS | Status: AC
  Administered 2017-12-23: 4 mg via INTRAVENOUS
  Filled 2017-12-23: qty 1

## 2017-12-23 MED ORDER — HYDROCODONE-ACETAMINOPHEN 5-325 MG PO TABS: 1.0000 | ORAL_TABLET | Freq: Four times a day (QID) | ORAL | 0 refills | Status: DC | PRN

## 2017-12-23 MED ORDER — TAMSULOSIN HCL 0.4 MG PO CAPS
0.4000 mg | ORAL_CAPSULE | Freq: Once | ORAL | Status: AC
  Administered 2017-12-23: 0.4 mg via ORAL
  Filled 2017-12-23: qty 1

## 2017-12-23 MED ORDER — TAMSULOSIN HCL 0.4 MG PO CAPS: 0.4000 mg | ORAL_CAPSULE | Freq: Every day | ORAL | 0 refills | Status: DC

## 2017-12-23 NOTE — ED Triage Notes (Signed)
Right flank pain on and off since yesterday.

## 2017-12-23 NOTE — ED Notes (Signed)
Pt called for triage, no response from lobby 

## 2017-12-23 NOTE — ED Provider Notes (Signed)
[facility] EMERGENCY DEPARTMENT Provider Note   CSN: [id number] Arrival date & time: 12/23/17  1906     History   Chief Complaint Chief Complaint  Patient presents with  . Flank Pain    HPI Christopher Hunter is a 62 y.o. male.  Patient is a 62 year old male with a history of kidney stones, low back pain, hypertension and migraines who is presents today with right flank pain that started yesterday.  He states it woke him up around 2 AM and became severe 10 out of 10 for several hours throughout the morning.  It then radiated around to his right side and stopped by the afternoon yesterday.  He was feeling pretty well until he went to bed last night and he had a mild twinge of pain in his right back.  This morning he seemed okay until about 2:00 in the afternoon when the pain returned to the same location and became severe.  Approximately a 10 out of 10.  When the pain is severe he has nausea and vomiting but has no urinary difficulty.  He is denied any fever.  He has not been constipated.  The history is provided by the patient.  Flank Pain  This is a recurrent problem. The current episode started yesterday. The problem occurs daily. The problem has been gradually improving. Associated symptoms include abdominal pain. Associated symptoms comments: Right flank pain that radiates into the right side of abd.  Pain when severe causes nausea and vomiting.  No urinary sx such as frequency or urgency.  No constipation.  No fever.  Patient has a history of kidney stones and last passed a stone several months ago.  However he states he is never had this stuttering pain like he did yesterday and today.. Nothing aggravates the symptoms. Nothing relieves the symptoms. Treatments tried: otc meds. The treatment provided no relief.    Past Medical History:  Diagnosis Date  . Atypical chest pain   . History of migraine headaches   . HTN (hypertension) dx'd about 2008   with white coat  component  . IFG (impaired fasting glucose)    A1c 5.7% August 2017  . Low back pain    episodic---RARE use of vicodin  . Nephrolithiasis    No active dz. (Dr. Vernie Ammons)  . 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 Right inguinal hernia 2019   asymptomatic    Patient Active Problem List   Diagnosis Date Noted  . Atypical chest pain 07/19/2013  . Health maintenance examination 08/31/2012  . Smokeless tobacco use 08/31/2012  . Colon cancer screening 09/29/2011  . HTN (hypertension), benign 09/28/2011    Past Surgical History:  Procedure Laterality Date  . CARDIOVASCULAR STRESS TEST  07/2013   Normal EF, no ischemia, mild hypertensive response to exercise  . INGUINAL HERNIA REPAIR     left  . LITHOTRIPSY  around 2009  . LUMBAR DISC SURGERY  1994   L5/S1 Dr. Lonny Prude        Home Medications    Prior to Admission medications   Medication Sig Start Date End Date Taking? Authorizing Provider  amLODipine (NORVASC) 10 MG tablet TAKE ONE (1) TABLET BY MOUTH EVERY DAY 12/09/17   McGowen, Maryjean Morn, MD  cetirizine (ZYRTEC) 10 MG tablet Take 10 mg by mouth daily.    [provider]  HYDROcodone-acetaminophen (NORCO/VICODIN) 5-325 MG tablet Take 1-2 tablets by mouth every 6 (six) hours as needed. Patient not taking:  Reported on 12/08/2017 08/15/17   Jeoffrey Massed, MD  ibuprofen (ADVIL,MOTRIN) 800 MG tablet TAKE ONE TABLET BY MOUTH EVERY EIGHT HOURS AS NEEDED 08/08/17   McGowen, Maryjean Morn, MD  irbesartan (AVAPRO) 300 MG tablet TAKE ONE (1) TABLET BY MOUTH EVERY DAY 09/09/17   McGowen, Maryjean Morn, MD  metoprolol succinate (TOPROL-XL) 50 MG 24 hr tablet Take 1 tablet (50 mg total) by mouth daily. Take with or immediately following a meal. 08/15/17   McGowen, Maryjean Morn, MD  Multiple Vitamin (MULTIVITAMIN) tablet Take 1 tablet by mouth daily.    [provider]    Family History No family history on file.  Social History Social History   Tobacco Use  . Smoking  status: Never Smoker  . Smokeless tobacco: Current User    Types: Snuff  Substance Use Topics  . Alcohol use: No  . Drug use: No     Allergies   Patient has no known allergies.   Review of Systems Review of Systems  Gastrointestinal: Positive for abdominal pain.  Genitourinary: Positive for flank pain.  All other systems reviewed and are negative.    Physical Exam Updated Vital Signs BP 140/82   Pulse 63   Temp 98.4 F (36.9 C) (Oral)   Resp 20   Ht 5' 9.5" (1.765 m)   Wt 92.5 kg   SpO2 99%   BMI 29.68 kg/m   Physical Exam  Constitutional: He is oriented to person, place, and time. He appears well-developed and well-nourished. No distress.  HENT:  Head: Normocephalic and atraumatic.  Mouth/Throat: Oropharynx is clear and moist.  Eyes: Pupils are equal, round, and reactive to light. Conjunctivae and EOM are normal.  Neck: Normal range of motion. Neck supple.  Cardiovascular: Normal rate, regular rhythm and intact distal pulses.  No murmur heard. Pulmonary/Chest: Effort normal and breath sounds normal. No respiratory distress. He has no wheezes. He has no rales.  Abdominal: Soft. He exhibits no distension. There is no tenderness. There is no rebound and no guarding.  Right CVA tenderness  Musculoskeletal: Normal range of motion. He exhibits no edema or tenderness.  Neurological: He is alert and oriented to person, place, and time.  Skin: Skin is warm and dry. No rash noted. No erythema.  Psychiatric: He has a normal mood and affect. His behavior is normal.  Nursing note and vitals reviewed.    ED Treatments / Results  Labs (all labs ordered are listed, but only abnormal results are displayed) Labs Reviewed  URINALYSIS, COMPLETE (UACMP) WITH MICROSCOPIC - Abnormal; Notable for the following components:      Result Value   APPearance CLOUDY (*)    Hgb urine dipstick LARGE (*)    Ketones, ur 15 (*)    Bacteria, UA RARE (*)    All other components within  normal limits  CBC WITH DIFFERENTIAL/PLATELET - Abnormal; Notable for the following components:   WBC 12.4 (*)    Neutro Abs 10.2 (*)    All other components within normal limits  BASIC METABOLIC PANEL - Abnormal; Notable for the following components:   Glucose, Bld 100 (*)    BUN 22 (*)    All other components within normal limits    EKG None  Radiology Ct Renal Stone Study  Result Date: 12/23/2017 CLINICAL DATA:  Personal history of kidney stones. Right flank pain for 2 days. EXAM: CT ABDOMEN AND PELVIS WITHOUT CONTRAST TECHNIQUE: Multidetector CT imaging of the abdomen and pelvis was performed following the  standard protocol without IV contrast. COMPARISON:  One view abdomen 11/01/2017. Abdominal ultrasound 11/22/2013. FINDINGS: Lower chest: Mild atelectasis is present in the left lung base. Lung bases are clear without focal nodule, mass, or airspace disease otherwise. Heart size is normal. No significant pleural or pericardial effusion is present. Hepatobiliary: No focal liver abnormality is seen. No gallstones, gallbladder wall thickening, or biliary dilatation. Pancreas: Unremarkable. No pancreatic ductal dilatation or surrounding inflammatory changes. Spleen: Choose Adrenals/Urinary Tract: Adrenal glands are normal bilaterally. An exophytic cyst involving the right kidney measures 5.5 cm. A cyst at the upper pole left kidney measures 2.4 cm. Multiple nonobstructing calcifications are present left kidney. The largest measures 8 x 9 mm. There is no obstruction on left. Mild right hydronephrosis is present. A 4 mm obstructing stone is present at the right UPJ. The more distal right ureter is within normal limits. The left ureter is unremarkable. The urinary bladder is within normal limits Stomach/Bowel: The stomach is filled with fluid. There is high-density material dependently in the fundus, likely chest. The duodenum is within normal limits. Small bowel is unremarkable. Terminal ileum is  within normal limits. The appendix is visualized and normal. The ascending and transverse colon are within normal limits. Descending and sigmoid colon are within normal limits. The rectum is unremarkable. Vascular/Lymphatic: Atherosclerotic calcifications are present in the aorta without aneurysm no significant adenopathy is present. Reproductive: The prostate gland is mildly enlarged, measuring 4.7 cm in transverse diameter. Central calcifications are noted. Other: Fat herniates into the inguinal canals bilaterally, left greater than right. Musculoskeletal: Vertebral body heights are maintained. Endplate Schmorl's nodes are present at the lower thoracic spine. Vacuum disc is present at L5-S1. Facet hypertrophy contributes to left greater than right foraminal stenosis at L4-5. No focal lytic or blastic lesions are present. Bony pelvis is intact. A bone island is present at the left ischial tuberosity. No other focal lytic or blastic lesions are present. IMPRESSION: 1. Moderate right-sided hydronephrosis secondary to a 4 mm obstructing stone at the right ureteropelvic junction. 2. Additional bilateral nonobstructing nephrolithiasis. The largest is in the midportion of the left kidney measuring 8 x 9 mm. 3.  Aortic Atherosclerosis (ICD10-I70.0). 4. Degenerative changes within the lower lumbar spine. Electronically Signed   By: Marin Robertshristopher  Mattern M.D.   On: 12/23/2017 20:49    Procedures Procedures (including critical care time)  Medications Ordered in ED Medications  morphine 4 MG/ML injection 4 mg (has no administration in time range)  ondansetron (ZOFRAN) injection 4 mg (has no administration in time range)     Initial Impression / Assessment and Plan / ED Course  I have reviewed the triage vital signs and the nursing notes.  Pertinent labs & imaging results that were available during my care of the patient were reviewed by me and considered in my medical decision making (see chart for  details).     Pt with symptoms consistent with kidney stone and hx of same.  Denies infectious sx, or GI symptoms.  Low concern for diverticulitis and no risk factors or history suggestive of AAA.  No hx suggestive of GU source (discharge) and otherwise pt is healthy.  Will  treat pain and ensure no infection with UA, CBC, BMP and will get stone study to further eval.  9:11 PM Mild luekocytosis of 12.4 with normal BMP.  UA with blood but no signs of infection.  CT is consistent with moderate right-sided hydronephrosis secondary to a 4 mm obstructing stone at the right UPJ.  Also patient is noted to have multiple bilateral stones the largest 8 x 9 mm in the left kidney.  Findings discussed with the patient and his wife.  He states he has plenty of hydrocodone at home as needed for the pain.  He will follow-up with Dr. Vernie Ammons if he does not pass the stone.  He was given Flomax.   Final Clinical Impressions(s) / ED Diagnoses   Final diagnoses:  Kidney stone    ED Discharge Orders         Ordered    tamsulosin (FLOMAX) 0.4 MG CAPS capsule  Daily after supper     12/23/17 2110           Gwyneth Sprout, MD 12/23/17 2112

## 2018-03-09 ENCOUNTER — Other Ambulatory Visit: Payer: Self-pay | Admitting: Family Medicine

## 2018-03-28 ENCOUNTER — Other Ambulatory Visit: Payer: Self-pay | Admitting: Family Medicine

## 2018-03-28 NOTE — Telephone Encounter (Signed)
RF request for ibuprofen 800 MG.  Last OV 12/08/17 Next OV 06/14/18  Last RX 08/08/17 # 180 X 1 rf.  Please advise.

## 2018-06-08 ENCOUNTER — Other Ambulatory Visit: Payer: Self-pay | Admitting: Family Medicine

## 2018-06-14 ENCOUNTER — Ambulatory Visit: Payer: 59 | Admitting: Family Medicine

## 2018-06-16 ENCOUNTER — Ambulatory Visit: Payer: 59 | Admitting: Family Medicine

## 2018-06-16 ENCOUNTER — Encounter: Payer: Self-pay | Admitting: Family Medicine

## 2018-06-16 VITALS — BP 125/72 | HR 64 | Temp 98.0°F | Resp 16 | Ht 69.5 in | Wt 207.4 lb

## 2018-06-16 DIAGNOSIS — I10 Essential (primary) hypertension: Secondary | ICD-10-CM | POA: Diagnosis not present

## 2018-06-16 DIAGNOSIS — E669 Obesity, unspecified: Secondary | ICD-10-CM

## 2018-06-16 MED ORDER — METOPROLOL SUCCINATE ER 50 MG PO TB24: 50.0000 mg | ORAL_TABLET | Freq: Every day | ORAL | 6 refills | Status: DC

## 2018-06-16 MED ORDER — AMLODIPINE BESYLATE 10 MG PO TABS: ORAL_TABLET | ORAL | 6 refills | Status: DC

## 2018-06-16 NOTE — Progress Notes (Signed)
OFFICE VISIT  06/16/2018   CC:  Chief Complaint  Patient presents with  . Follow-up    RCI     HPI:    Patient is a 63 y.o. Caucasian male who presents for f/u HTN with white coat component. Only checks bp occasionally and gets 120s/70s. No problems with meds.  Not exercising much, other than his physical labor with his job.  No new problems.  Past Medical History:  Diagnosis Date  . Atypical chest pain   . History of migraine headaches   . HTN (hypertension) dx'd about 2008   with white coat component  . IFG (impaired fasting glucose)    A1c 5.7% August 2017  . Low back pain    episodic---RARE use of vicodin  . Nephrolithiasis    No active dz. (Dr. Vernie Ammons)  . 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 Right inguinal hernia 2019   asymptomatic    Past Surgical History:  Procedure Laterality Date  . CARDIOVASCULAR STRESS TEST  07/2013   Normal EF, no ischemia, mild hypertensive response to exercise  . INGUINAL HERNIA REPAIR     left  . LITHOTRIPSY  around 2009  . LUMBAR DISC SURGERY  1994   L5/S1 Dr. Lonny Prude    Outpatient Medications Prior to Visit  Medication Sig Dispense Refill  . cetirizine (ZYRTEC) 10 MG tablet Take 10 mg by mouth daily.    Marland Kitchen ibuprofen (ADVIL,MOTRIN) 800 MG tablet TAKE ONE TABLET BY MOUTH EVERY EIGHT HOURS AS NEEDED 180 tablet 1  . irbesartan (AVAPRO) 300 MG tablet TAKE ONE (1) TABLET BY MOUTH EVERY DAY 30 tablet 5  . Multiple Vitamin (MULTIVITAMIN) tablet Take 1 tablet by mouth daily.    Marland Kitchen amLODipine (NORVASC) 10 MG tablet TAKE ONE (1) TABLET BY MOUTH EVERY DAY 30 tablet 0  . metoprolol succinate (TOPROL-XL) 50 MG 24 hr tablet Take 1 tablet (50 mg total) by mouth daily. Take with or immediately following a meal. 90 tablet 3  . HYDROcodone-acetaminophen (NORCO/VICODIN) 5-325 MG tablet Take 1-2 tablets by mouth every 6 (six) hours as needed. (Patient not taking: Reported on 06/16/2018) 10 tablet 0  . tamsulosin (FLOMAX) 0.4 MG  CAPS capsule Take 1 capsule (0.4 mg total) by mouth daily after supper. (Patient not taking: Reported on 06/16/2018) 5 capsule 0   No facility-administered medications prior to visit.     No Known Allergies  ROS As per HPI  PE: Blood pressure 125/72, pulse 64, temperature 98 F (36.7 C), temperature source Oral, resp. rate 16, height 5' 9.5" (1.765 m), weight 207 lb 6.4 oz (94.1 kg), SpO2 98 %. Body mass index is 30.19 kg/m. Body mass index is 30.19 kg/m.   Gen: Alert, well appearing.  Patient is oriented to person, place, time, and situation. AFFECT: pleasant, lucid thought and speech. CV: RRR, no m/r/g.   LUNGS: CTA bilat, nonlabored resps, good aeration in all lung fields. EXT: no clubbing or cyanosis.  no edema.    LABS:    Chemistry      Component Value Date/Time   NA 139 12/23/2017 1959   K 3.5 12/23/2017 1959   CL 101 12/23/2017 1959   CO2 29 12/23/2017 1959   BUN 22 (H) 12/23/2017 1959   CREATININE 1.19 12/23/2017 1959      Component Value Date/Time   CALCIUM 9.2 12/23/2017 1959   ALKPHOS 58 12/08/2017 0842   AST 16 12/08/2017 0842   ALT 17 12/08/2017 0842  BILITOT 1.2 12/08/2017 0842      IMPRESSION AND PLAN:  HTN; The current medical regimen is effective;  continue present plan and medications. RF'd meds. Encouraged more efforts at [facility].  An After Visit Summary was printed and given to the patient.  FOLLOW UP: Return in about 6 months (around 12/15/2018) for annual CPE (fasting).  Signed:  Santiago Bumpers, MD           06/16/2018

## 2018-09-06 ENCOUNTER — Other Ambulatory Visit: Payer: Self-pay | Admitting: Family Medicine

## 2018-10-03 ENCOUNTER — Other Ambulatory Visit: Payer: Self-pay | Admitting: Family Medicine

## 2018-10-03 ENCOUNTER — Telehealth: Payer: Self-pay | Admitting: Family Medicine

## 2018-10-03 NOTE — Telephone Encounter (Signed)
Please advise, thanks.

## 2018-10-03 NOTE — Telephone Encounter (Signed)
Patient called and wanted to know an update on phone call he made earlier this morning. Stated he could come in for testing for COVID-19. His symptoms include, fever 101+, shortness of breath, cough, loss of taste. He doesn't remember name of person he spoke with, nor can I find telephone message where he spoke to someone at our office.  I told him he had to get an order from a doctor, which would mean he had to have a virtual appt.   His employer states that he cannot go back to work until tested.  Please advise and contact patient.  Thank you

## 2018-10-03 NOTE — Telephone Encounter (Signed)
Patient called back to set up virtual appt with McGowen.  I asked about his shortness of breath, he said he did not have any. Cough just a  Little. Diarrhea couple of days prior to yesterday 6/1 On 6/1 started to feal weak, aches and pains, no appetite, chills, fever 101.3  Per his employer he needs to be tested before reporting back  appt on 6/3 with McGowen

## 2018-10-03 NOTE — Telephone Encounter (Signed)
RF request for 800mg  Ibuprofen. Last OV 06/16/2018 Next OV 12/22/2018 Last RX 03/28/2018 # 180 x 1 rf.  Okay to fill?

## 2018-10-03 NOTE — Telephone Encounter (Signed)
If patient has shortness of breath then he needs to go to the nearest emergency room. If NOT feeling SOB, then he needs virtual visit and it can be determined if covid 19 testing is appropriate.

## 2018-10-04 ENCOUNTER — Other Ambulatory Visit: Payer: Self-pay

## 2018-10-04 ENCOUNTER — Encounter: Payer: Self-pay | Admitting: Family Medicine

## 2018-10-04 ENCOUNTER — Telehealth: Payer: Self-pay | Admitting: General Practice

## 2018-10-04 ENCOUNTER — Other Ambulatory Visit: Payer: 59

## 2018-10-04 ENCOUNTER — Ambulatory Visit (INDEPENDENT_AMBULATORY_CARE_PROVIDER_SITE_OTHER): Payer: 59 | Admitting: Family Medicine

## 2018-10-04 VITALS — BP 120/68 | HR 63 | Temp 98.6°F

## 2018-10-04 DIAGNOSIS — R5381 Other malaise: Secondary | ICD-10-CM

## 2018-10-04 DIAGNOSIS — R432 Parageusia: Secondary | ICD-10-CM

## 2018-10-04 DIAGNOSIS — R058 Other specified cough: Secondary | ICD-10-CM

## 2018-10-04 DIAGNOSIS — B349 Viral infection, unspecified: Secondary | ICD-10-CM | POA: Diagnosis not present

## 2018-10-04 DIAGNOSIS — R197 Diarrhea, unspecified: Secondary | ICD-10-CM

## 2018-10-04 DIAGNOSIS — R509 Fever, unspecified: Secondary | ICD-10-CM

## 2018-10-04 DIAGNOSIS — R05 Cough: Secondary | ICD-10-CM

## 2018-10-04 DIAGNOSIS — R52 Pain, unspecified: Secondary | ICD-10-CM

## 2018-10-04 DIAGNOSIS — Z20822 Contact with and (suspected) exposure to covid-19: Secondary | ICD-10-CM

## 2018-10-04 DIAGNOSIS — R5383 Other fatigue: Secondary | ICD-10-CM

## 2018-10-04 NOTE — Telephone Encounter (Signed)
Pt was scheduled for Covid-19 testing.  Pt was referred by PCP Nicoletta Ba MD   Scheduled w/ pt at [facility].

## 2018-10-04 NOTE — Telephone Encounter (Signed)
-----   Message from Pollie Meyer, [place] sent at 10/04/2018 11:26 AM EDT ----- Regarding: COVID-19 DOB:2055/11/26 YKZ:[id number] Reason for testing: fever and cough Insurance info: [facility], policy number: [id number]

## 2018-10-04 NOTE — Progress Notes (Signed)
Virtual Visit via Video Note  I connected with pt on 10/04/18 at 11:00 AM EDT by a video enabled telemedicine application and verified that I am speaking with the correct person using two identifiers.  Location patient: home Location provider:work or home office Persons participating in the virtual visit: patient, provider  I discussed the limitations of evaluation and management by telemedicine and the availability of in person appointments. The patient expressed understanding and agreed to proceed.  Telemedicine visit is a necessity given the COVID-19 restrictions in place at the current time.  HPI: 63 y/o WM being seen today for fever. Onset 5-6 d/a, some diarrhea and mild malaise as well as nasal congestion w/PND. Lost sense of taste next day, started to have chills, feel more fatigued, T 101.3 at that time. Temp persisted through that day, muscles ached, still had diarrhea (approx 3 loose BMs per day, no blood or mucous in stool). Ibup 800 mg 2 nights ago, temp 99 or better yest morning.  He had some dry coughing when he had fever.  No ST.  No SOB.  He has had some nausea but no vomiting.  No appetite.  Poor taste sensation.    ROS: See pertinent positives and negatives per HPI.  Past Medical History:  Diagnosis Date  . Atypical chest pain   . History of migraine headaches   . HTN (hypertension) dx'd about 2008   with white coat component  . IFG (impaired fasting glucose)    A1c 5.7% August 2017  . Low back pain    episodic---RARE use of vicodin  . Nephrolithiasis    No active dz. (Dr. Vernie Ammons)  . 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 Right inguinal hernia 2019   asymptomatic    Past Surgical History:  Procedure Laterality Date  . CARDIOVASCULAR STRESS TEST  07/2013   Normal EF, no ischemia, mild hypertensive response to exercise  . INGUINAL HERNIA REPAIR     left  . LITHOTRIPSY  around 2009  . LUMBAR DISC SURGERY  1994   L5/S1 Dr. Lonny Prude    No  family history on file.  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   Current Outpatient Medications:  .  amLODipine (NORVASC) 10 MG tablet, 1 tab po qd, Disp: 30 tablet, Rfl: 6 .  cetirizine (ZYRTEC) 10 MG tablet, Take 10 mg by mouth daily., Disp: , Rfl:  .  ibuprofen (ADVIL) 800 MG tablet, TAKE ONE TABLET BY MOUTH EVERY EIGHT HOURS AS NEEDED, Disp: 180 tablet, Rfl: 1 .  irbesartan (AVAPRO) 300 MG tablet, TAKE ONE (1) TABLET BY MOUTH EVERY DAY, Disp: 30 tablet, Rfl: 2 .  metoprolol succinate (TOPROL-XL) 50 MG 24 hr tablet, Take 1 tablet (50 mg total) by mouth daily. Take with or immediately following a meal., Disp: 30 tablet, Rfl: 6 .  Multiple Vitamin (MULTIVITAMIN) tablet, Take 1 tablet by mouth daily., Disp: , Rfl:  .  HYDROcodone-acetaminophen (NORCO/VICODIN) 5-325 MG tablet, Take 1-2 tablets by mouth every 6 (six) hours as needed. (Patient not taking: Reported on 06/16/2018), Disp: 10 tablet, Rfl: 0 .  tamsulosin (FLOMAX) 0.4 MG CAPS capsule, Take 1 capsule (0.4 mg total) by mouth daily after supper. (Patient not taking: Reported on 06/16/2018), Disp: 5 capsule, Rfl: 0  EXAM:  VITALS per patient if applicable: BP 120/68 (BP Location: Left Arm, Patient Position: Sitting, Cuff Size: Normal)   Pulse 63   Temp 98.6 F (37  C) (Oral)    GENERAL: alert, oriented, appears well and in no acute distress  HEENT: atraumatic, conjunttiva clear, no obvious abnormalities on inspection of external nose and ears  NECK: normal movements of the head and neck  LUNGS: on inspection no signs of respiratory distress, breathing rate appears normal, no obvious gross SOB, gasping or wheezing  CV: no obvious cyanosis  MS: moves all visible extremities without noticeable abnormality  PSYCH/NEURO: pleasant and cooperative, no obvious depression or anxiety, speech and thought processing grossly intact  LABS: none today    Chemistry      Component Value Date/Time   NA  139 12/23/2017 1959   K 3.5 12/23/2017 1959   CL 101 12/23/2017 1959   CO2 29 12/23/2017 1959   BUN 22 (H) 12/23/2017 1959   CREATININE 1.19 12/23/2017 1959      Component Value Date/Time   CALCIUM 9.2 12/23/2017 1959   ALKPHOS 58 12/08/2017 0842   AST 16 12/08/2017 0842   ALT 17 12/08/2017 0842   BILITOT 1.2 12/08/2017 0842      ASSESSMENT AND PLAN:  Discussed the following assessment and plan:  1) Fever, dry cough, diarrhea, loss of taste, malaise/fatigue/aches-->NOT SOB. Viral syndrome suspected. I consider him high risk for complications if he has covid 3019 based on age, HTN, prediabetes. Will arrange [facility]pt covid 19 testing [facility]. Symptomatic care discussed, self quarantine measures discussed.  I discussed the assessment and treatment plan with the patient. The patient was provided an opportunity to ask questions and all were answered. The patient agreed with the plan and demonstrated an understanding of the instructions.   The patient was advised to call back or seek an in-person evaluation if the symptoms worsen or if the condition fails to improve as anticipated.  F/u: prn  Signed:  Santiago BumpersPhil Azya Barbero, MD           10/04/2018

## 2018-10-04 NOTE — Addendum Note (Signed)
Addended by: Amado Coe on: 10/04/2018 12:09 PM   Modules accepted: Orders

## 2018-10-06 ENCOUNTER — Encounter: Payer: Self-pay | Admitting: Family Medicine

## 2018-10-06 ENCOUNTER — Telehealth: Payer: Self-pay | Admitting: Family Medicine

## 2018-10-06 LAB — NOVEL CORONAVIRUS, NAA: SARS-CoV-2, NAA: DETECTED — AB

## 2018-10-06 NOTE — Telephone Encounter (Signed)
Tiffany called from [facility] with a critical lab  COVID test  -> ->  Positive    Thank you

## 2018-10-06 NOTE — Telephone Encounter (Signed)
Patient called to go over COVID-19 test results. He received a my chart message and does not quite understand.

## 2018-10-06 NOTE — Telephone Encounter (Signed)
Per PCP, as long as pt has no fever for 3 days w/o having to take Tylenol or Ibuprofen and feeling better for minimum of 7 days from start of symptoms. Okay for work note.   Pt was advised and stated he is feeling better and has been without fever for 7 days.

## 2018-10-06 NOTE — Telephone Encounter (Signed)
Noted/Agree. I spoke with pt at the time of his virtual visit and we reviewed the self quarantine measures and return to work criteria at that time as well.

## 2018-10-20 ENCOUNTER — Encounter (HOSPITAL_BASED_OUTPATIENT_CLINIC_OR_DEPARTMENT_OTHER): Payer: Self-pay | Admitting: Emergency Medicine

## 2018-10-20 ENCOUNTER — Emergency Department (HOSPITAL_BASED_OUTPATIENT_CLINIC_OR_DEPARTMENT_OTHER)
Admission: EM | Admit: 2018-10-20 | Discharge: 2018-10-20 | Disposition: A | Discharge status: Home / Self Care | Payer: 59 | Attending: Emergency Medicine | Admitting: Emergency Medicine

## 2018-10-20 ENCOUNTER — Emergency Department (HOSPITAL_BASED_OUTPATIENT_CLINIC_OR_DEPARTMENT_OTHER): Payer: 59

## 2018-10-20 ENCOUNTER — Other Ambulatory Visit: Payer: Self-pay

## 2018-10-20 ENCOUNTER — Telehealth: Payer: Self-pay | Admitting: Family Medicine

## 2018-10-20 ENCOUNTER — Encounter: Payer: Self-pay | Admitting: Family Medicine

## 2018-10-20 DIAGNOSIS — U071 COVID-19: Secondary | ICD-10-CM | POA: Diagnosis not present

## 2018-10-20 DIAGNOSIS — I10 Essential (primary) hypertension: Secondary | ICD-10-CM | POA: Diagnosis not present

## 2018-10-20 DIAGNOSIS — R0602 Shortness of breath: Secondary | ICD-10-CM | POA: Diagnosis present

## 2018-10-20 LAB — COMPREHENSIVE METABOLIC PANEL
ALT: 42 U/L (ref 0–44)
AST: 30 U/L (ref 15–41)
Albumin: 3.5 g/dL (ref 3.5–5.0)
Alkaline Phosphatase: 66 U/L (ref 38–126)
Anion gap: 8 (ref 5–15)
BUN: 19 mg/dL (ref 8–23)
CO2: 27 mmol/L (ref 22–32)
Calcium: 8.8 mg/dL — ABNORMAL LOW (ref 8.9–10.3)
Chloride: 107 mmol/L (ref 98–111)
Creatinine, Ser: 0.88 mg/dL (ref 0.61–1.24)
GFR calc Af Amer: >60 mL/min (ref >60)
GFR calc non Af Amer: >60 mL/min (ref >60)
Glucose, Bld: 108 mg/dL — ABNORMAL HIGH (ref 70–99)
Potassium: 3.3 mmol/L — ABNORMAL LOW (ref 3.5–5.1)
Sodium: 142 mmol/L (ref 135–145)
Total Bilirubin: 0.6 mg/dL (ref 0.3–1.2)
Total Protein: 6.5 g/dL (ref 6.5–8.1)

## 2018-10-20 LAB — CBC WITH DIFFERENTIAL/PLATELET
Abs Immature Granulocytes: 0 10*3/uL (ref 0.00–0.07)
Basophils Absolute: 0.1 10*3/uL (ref 0.0–0.1)
Basophils Relative: 1 %
Eosinophils Absolute: 0.1 10*3/uL (ref 0.0–0.5)
Eosinophils Relative: 3 %
HCT: 41 % (ref 39.0–52.0)
Hemoglobin: 13.9 g/dL (ref 13.0–17.0)
Immature Granulocytes: 0 %
Lymphocytes Relative: 20 %
Lymphs Abs: 0.9 10*3/uL (ref 0.7–4.0)
MCH: 31 pg (ref 26.0–34.0)
MCHC: 33.9 g/dL (ref 30.0–36.0)
MCV: 91.3 fL (ref 80.0–100.0)
Monocytes Absolute: 0.8 10*3/uL (ref 0.1–1.0)
Monocytes Relative: 17 %
Neutro Abs: 2.7 10*3/uL (ref 1.7–7.7)
Neutrophils Relative %: 59 %
Platelets: 120 10*3/uL — ABNORMAL LOW (ref 150–400)
RBC: 4.49 MIL/uL (ref 4.22–5.81)
RDW: 12.1 % (ref 11.5–15.5)
WBC: 4.5 10*3/uL (ref 4.0–10.5)
nRBC: 0 % (ref 0.0–0.2)

## 2018-10-20 MED ORDER — SODIUM CHLORIDE 0.9 % IV BOLUS
1000.0000 mL | Freq: Once | INTRAVENOUS | Status: AC
  Administered 2018-10-20: 1000 mL via INTRAVENOUS

## 2018-10-20 NOTE — Telephone Encounter (Signed)
ContacedCopied from [place] [id number]. Topic: Quick Sport and exercise psychologist Patient (Clinic Use ONLY) >> Oct 20, 2018  8:19 AM [name] wrote: Reason for CRM: pt wife wendy is returning heather call concerning what her husband talk about with dr Anitra Lauth last week.   Contacted patient's wife, [name] per [place].  Patient began feeling poorly yesterday and slight fever of 100.   Patient tested positive for COVID 19 on 10/04/2018.   Around 6pm yesterday patient reported 102 fever with cough, body aches, and just feeling lousy.   Spoke to Dr Anitra Lauth concerning patient, he reports patient needs to go to ER.  Contacted patient's wife to advise, patient will go to ER now.

## 2018-10-20 NOTE — ED Triage Notes (Addendum)
Pt c/o fever 102 last pm; sts had general malaise and body aches; sts he feels worse now than he did when he was dx with COVID; also had diarrhea a few times since last pm; sts neck is sore to move it

## 2018-10-20 NOTE — ED Provider Notes (Signed)
[facility] EMERGENCY DEPARTMENT Provider Note   CSN: [id number] Arrival date & time: 10/20/18  1709    History   Chief Complaint Chief Complaint  Patient presents with  . Fever  . Generalized Body Aches    HPI Christopher Hunter is a 63 y.o. male.     HPI Patient presents with body aches shortness of breath and fatigue.  16 days ago diagnosed with a COVID-19 infection.  Had positive test.  Has been doing better and has actually been back to work around 2 weeks.  However did develop fever yesterday.  Does have occasional cough.  States yesterday his neck was sore and had trouble moving it.  Has had a headache also.  Still occasional cough.  Slight abdominal pain.  No numbness or weakness.  No confusion.  States he had a decreased appetite.  States when he first had the infection he had decreased taste but now is not having that. Past Medical History:  Diagnosis Date  . Atypical chest pain   . History of migraine headaches   . HTN (hypertension) dx'd about 2008   with white coat component  . IFG (impaired fasting glucose)    A1c 5.7% August 2017  . Low back pain    episodic---RARE use of vicodin  . Nephrolithiasis    No active dz. (Dr. Karsten Ro)  . 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 Right inguinal hernia 2019   asymptomatic    Patient Active Problem List   Diagnosis Date Noted  . Atypical chest pain 07/19/2013  . Health maintenance examination 08/31/2012  . Smokeless tobacco use 08/31/2012  . Colon cancer screening 09/29/2011  . HTN (hypertension), benign 09/28/2011    Past Surgical History:  Procedure Laterality Date  . CARDIOVASCULAR STRESS TEST  07/2013   Normal EF, no ischemia, mild hypertensive response to exercise  . INGUINAL HERNIA REPAIR     left  . LITHOTRIPSY  around 2009  . LUMBAR DISC SURGERY  1994   L5/S1 Dr. Overton Mam        Home Medications    Prior to Admission medications   Medication Sig Start Date End Date  Taking? Authorizing Provider  amLODipine (NORVASC) 10 MG tablet 1 tab po qd 06/16/18   McGowen, Adrian Blackwater, MD  cetirizine (ZYRTEC) 10 MG tablet Take 10 mg by mouth daily.    [provider]  HYDROcodone-acetaminophen (NORCO/VICODIN) 5-325 MG tablet Take 1-2 tablets by mouth every 6 (six) hours as needed. Patient not taking: Reported on 06/16/2018 12/23/17   Blanchie Dessert, MD  ibuprofen (ADVIL) 800 MG tablet TAKE ONE TABLET BY MOUTH EVERY EIGHT HOURS AS NEEDED 10/03/18   McGowen, Adrian Blackwater, MD  irbesartan (AVAPRO) 300 MG tablet TAKE ONE (1) TABLET BY MOUTH EVERY DAY 09/06/18   McGowen, Adrian Blackwater, MD  metoprolol succinate (TOPROL-XL) 50 MG 24 hr tablet Take 1 tablet (50 mg total) by mouth daily. Take with or immediately following a meal. 06/16/18   McGowen, Adrian Blackwater, MD  Multiple Vitamin (MULTIVITAMIN) tablet Take 1 tablet by mouth daily.    [provider]  tamsulosin (FLOMAX) 0.4 MG CAPS capsule Take 1 capsule (0.4 mg total) by mouth daily after supper. Patient not taking: Reported on 06/16/2018 12/23/17   Blanchie Dessert, MD    Family History No family history on file.  Social History Social History   Tobacco Use  . Smoking status: Never Smoker  . Smokeless tobacco: Current User  Types: Snuff  Substance Use Topics  . Alcohol use: No  . Drug use: No     Allergies   Patient has no known allergies.   Review of Systems Review of Systems  Constitutional: Positive for appetite change, fatigue and fever.  HENT: Negative for congestion.   Respiratory: Positive for cough.   Gastrointestinal: Negative for abdominal pain.  Genitourinary: Negative for flank pain.  Musculoskeletal: Positive for neck pain.  Skin: Negative for rash.  Neurological: Positive for headaches.  Psychiatric/Behavioral: Negative for confusion.     Physical Exam Updated Vital Signs BP (!) 142/76   Pulse 66   Temp 99 F (37.2 C) (Oral)   Resp 16   Ht 5' 10.5" (1.791 m)   Wt 95.3 kg   SpO2  100%   BMI 29.71 kg/m   Physical Exam Vitals signs and nursing note reviewed.  HENT:     Head: Atraumatic.  Neck:     Musculoskeletal: Neck supple. No neck rigidity.  Cardiovascular:     Rate and Rhythm: Normal rate and regular rhythm.  Pulmonary:     Effort: No respiratory distress.     Breath sounds: No stridor. No wheezing, rhonchi or rales.  Abdominal:     Tenderness: There is no abdominal tenderness.  Musculoskeletal:     Right lower leg: No edema.     Left lower leg: No edema.  Skin:    General: Skin is warm.     Capillary Refill: Capillary refill takes less than 2 seconds.  Neurological:     Mental Status: He is alert. Mental status is at baseline.      ED Treatments / Results  Labs (all labs ordered are listed, but only abnormal results are displayed) Labs Reviewed  COMPREHENSIVE METABOLIC PANEL - Abnormal; Notable for the following components:      Result Value   Potassium 3.3 (*)    Glucose, Bld 108 (*)    Calcium 8.8 (*)    All other components within normal limits  CBC WITH DIFFERENTIAL/PLATELET - Abnormal; Notable for the following components:   Platelets 120 (*)    All other components within normal limits  NOVEL CORONAVIRUS, NAA (HOSPITAL ORDER, SEND-OUT TO REF LAB)    EKG None  Radiology Dg Chest Portable 1 View  Result Date: 10/20/2018 CLINICAL DATA:  Fever and cough since last evening. COVID-19 positive 3 weeks ago. EXAM: PORTABLE CHEST 1 VIEW COMPARISON:  None. FINDINGS: The cardiac silhouette, mediastinal and hilar contours are normal. The lungs are clear. No pleural effusion. The bony thorax is intact. IMPRESSION: No acute cardiopulmonary findings. Electronically Signed   By: Rudie MeyerP.  Gallerani M.D.   On: 10/20/2018 19:28    Procedures Procedures (including critical care time)  Medications Ordered in ED Medications  sodium chloride 0.9 % bolus 1,000 mL ( Intravenous Stopped 10/20/18 1946)     Initial Impression / Assessment and Plan / ED  Course  I have reviewed the triage vital signs and the nursing notes.  Pertinent labs & imaging results that were available during my care of the patient were reviewed by me and considered in my medical decision making (see chart for details).        Patient with current fever and aches.  Around 2 weeks ago had covid infection.  X-ray reassuring now.  Fevers at home.  However good vitals.  Will recheck with the [facility] test to see if he still seems to be shedding virus since this could be different infection.  Discharge home and PCP can help follow-up.  Final Clinical Impressions(s) / ED Diagnoses   Final diagnoses:  COVID-19    ED Discharge Orders    None       Benjiman CorePickering, Izzac Rockett, MD 10/20/18 2141

## 2018-10-20 NOTE — Telephone Encounter (Signed)
Noted agree

## 2018-10-20 NOTE — Discharge Instructions (Signed)
Dr. Anitra Lauth can follow the repeat COVID test.  Keep yourself isolated.

## 2018-10-24 ENCOUNTER — Encounter: Payer: Self-pay | Admitting: Family Medicine

## 2018-10-24 LAB — NOVEL CORONAVIRUS, NAA (HOSP ORDER, SEND-OUT TO REF LAB; TAT 18-24 HRS): SARS-CoV-2, NAA: DETECTED — AB

## 2018-10-25 NOTE — Telephone Encounter (Signed)
These are lingering symptoms of a viral infection (like covid 19).  No need for any new recommendations currently. Needs to be drinking lots of water and gatorade.  Don't take any tylenol/motrin/aleve/aspirin UNLESS temp is >100.5 (don't take them for aches/pains).

## 2018-10-30 ENCOUNTER — Encounter: Payer: Self-pay | Admitting: Family Medicine

## 2018-11-02 ENCOUNTER — Other Ambulatory Visit: Payer: Self-pay | Admitting: Family Medicine

## 2018-11-02 NOTE — Telephone Encounter (Signed)
Yes, otc cold med is fine to take. I'll do letter.  Can you have Diane E-mail it to him?-thx

## 2018-12-05 ENCOUNTER — Other Ambulatory Visit: Payer: Self-pay | Admitting: Family Medicine

## 2018-12-22 ENCOUNTER — Other Ambulatory Visit: Payer: Self-pay

## 2018-12-22 ENCOUNTER — Encounter: Payer: Self-pay | Admitting: Family Medicine

## 2018-12-22 ENCOUNTER — Telehealth: Payer: Self-pay

## 2018-12-22 ENCOUNTER — Ambulatory Visit (INDEPENDENT_AMBULATORY_CARE_PROVIDER_SITE_OTHER): Payer: 59 | Admitting: Family Medicine

## 2018-12-22 VITALS — BP 129/75 | HR 60 | Temp 98.2°F | Resp 16 | Ht 69.5 in | Wt 199.6 lb

## 2018-12-22 DIAGNOSIS — I10 Essential (primary) hypertension: Secondary | ICD-10-CM

## 2018-12-22 DIAGNOSIS — Z0000 Encounter for general adult medical examination without abnormal findings: Secondary | ICD-10-CM | POA: Diagnosis not present

## 2018-12-22 DIAGNOSIS — R7301 Impaired fasting glucose: Secondary | ICD-10-CM | POA: Diagnosis not present

## 2018-12-22 DIAGNOSIS — Z125 Encounter for screening for malignant neoplasm of prostate: Secondary | ICD-10-CM | POA: Diagnosis not present

## 2018-12-22 LAB — LIPID PANEL
Cholesterol: 175 mg/dL (ref 0–200)
HDL: 59.2 mg/dL (ref >39.00)
LDL Cholesterol: 102 mg/dL — ABNORMAL HIGH (ref 0–99)
NonHDL: 115.87
Total CHOL/HDL Ratio: 3
Triglycerides: 67 mg/dL (ref 0.0–149.0)
VLDL: 13.4 mg/dL (ref 0.0–40.0)

## 2018-12-22 LAB — BASIC METABOLIC PANEL
BUN: 20 mg/dL (ref 6–23)
CO2: 24 mEq/L (ref 19–32)
Calcium: 9.3 mg/dL (ref 8.4–10.5)
Chloride: 105 mEq/L (ref 96–112)
Creatinine, Ser: 0.91 mg/dL (ref 0.40–1.50)
GFR: 84.49 mL/min (ref >60.00)
Glucose, Bld: 86 mg/dL (ref 70–99)
Potassium: 3.8 mEq/L (ref 3.5–5.1)
Sodium: 141 mEq/L (ref 135–145)

## 2018-12-22 LAB — HEMOGLOBIN A1C: Hgb A1c MFr Bld: 5.6 % (ref 4.6–6.5)

## 2018-12-22 LAB — PSA: PSA: 1.44 ng/mL (ref 0.10–4.00)

## 2018-12-22 MED ORDER — HYDROCODONE-ACETAMINOPHEN 5-325 MG PO TABS: 1.0000 | ORAL_TABLET | Freq: Four times a day (QID) | ORAL | 0 refills | Status: DC | PRN

## 2018-12-22 NOTE — Patient Instructions (Signed)

## 2018-12-22 NOTE — Addendum Note (Signed)
Addended by: Tammi Sou on: 12/22/2018 09:17 AM   Modules accepted: Orders

## 2018-12-22 NOTE — Telephone Encounter (Signed)
Pt had CPE appt today and brought his health form to be completed. Some of it has been filled out and the rest will be completed after labs have been done. Form is on my desk under "To be done" label.

## 2018-12-22 NOTE — Progress Notes (Addendum)
Office Note 12/22/2018  CC:  Chief Complaint  Patient presents with  . Annual Exam    pt is fasting    HPI:  Christopher Hunter is a 63 y.o. White male who is here for annual health maintenance exam.  "I feel great". No acute complaints. Not working on anything with his diet. No exercise but his work is active and he is not sedentary at home.  Past Medical History:  Diagnosis Date  . Atypical chest pain   . COVID-19 virus infection summer 2020   no hosp  . History of migraine headaches   . HTN (hypertension) dx'd about 2008   with white coat component  . IFG (impaired fasting glucose)    A1c 5.7% August 2017  . Low back pain    episodic---RARE use of vicodin  . Nephrolithiasis    No active dz. (Dr. Karsten Ro)  . 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 Right inguinal hernia 2019   asymptomatic    Past Surgical History:  Procedure Laterality Date  . CARDIOVASCULAR STRESS TEST  07/2013   Normal EF, no ischemia, mild hypertensive response to exercise  . INGUINAL HERNIA REPAIR     left  . LITHOTRIPSY  around 2009  . LUMBAR DISC SURGERY  1994   L5/S1 Dr. Overton Mam    History reviewed. No pertinent family history.  Social History   Socioeconomic History  . Marital status: Married    Spouse name: Not on file  . Number of children: Not on file  . Years of education: Not on file  . Highest education level: Not on file  Occupational History  . Not on file  Social Needs  . Financial resource strain: Not on file  . Food insecurity    Worry: Not on file    Inability: Not on file  . Transportation needs    Medical: Not on file    Non-medical: Not on file  Tobacco Use  . Smoking status: Never Smoker  . Smokeless tobacco: Current User    Types: Snuff  Substance and Sexual Activity  . Alcohol use: No  . Drug use: No  . Sexual activity: Not on file  Lifestyle  . Physical activity    Days per week: Not on file    Minutes per session: Not on file   . Stress: Not on file  Relationships  . Social Herbalist on phone: Not on file    Gets together: Not on file    Attends religious service: Not on file    Active member of club or organization: Not on file    Attends meetings of clubs or organizations: Not on file    Relationship status: Not on file  . Intimate partner violence    Fear of current or ex partner: Not on file    Emotionally abused: Not on file    Physically abused: Not on file    Forced sexual activity: Not on file  Other Topics Concern  . Not on file  Social History Narrative   Married, one 81 yr old son.   Lives in [place], [place].  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   Never smoker, no ETOH, no drugs.       Outpatient Medications Prior to Visit  Medication Sig Dispense Refill  . amLODipine (NORVASC) 10 MG tablet 1 tab po qd 30 tablet 6  . cetirizine (ZYRTEC) 10 MG tablet Take 10 mg by mouth  daily.    . ibuprofen (ADVIL) 800 MG tablet TAKE ONE TABLET BY MOUTH EVERY EIGHT HOURS AS NEEDED 180 tablet 1  . irbesartan (AVAPRO) 300 MG tablet TAKE ONE (1) TABLET BY MOUTH EVERY DAY 30 tablet 2  . metoprolol succinate (TOPROL-XL) 50 MG 24 hr tablet Take 1 tablet (50 mg total) by mouth daily. Take with or immediately following a meal. 30 tablet 6  . Multiple Vitamin (MULTIVITAMIN) tablet Take 1 tablet by mouth daily.    Marland Kitchen. HYDROcodone-acetaminophen (NORCO/VICODIN) 5-325 MG tablet Take 1-2 tablets by mouth every 6 (six) hours as needed. (Patient not taking: Reported on 06/16/2018) 10 tablet 0  . tamsulosin (FLOMAX) 0.4 MG CAPS capsule Take 1 capsule (0.4 mg total) by mouth daily after supper. (Patient not taking: Reported on 06/16/2018) 5 capsule 0   No facility-administered medications prior to visit.     No Known Allergies  ROS Review of Systems  Constitutional: Negative for appetite change, chills, fatigue and fever.  HENT: Negative for congestion, dental problem, ear pain and sore throat.    Eyes: Negative for discharge, redness and visual disturbance.  Respiratory: Negative for cough, chest tightness, shortness of breath and wheezing.   Cardiovascular: Negative for chest pain, palpitations and leg swelling.  Gastrointestinal: Negative for abdominal pain, blood in stool, diarrhea, nausea and vomiting.  Genitourinary: Negative for difficulty urinating, dysuria, flank pain, frequency, hematuria and urgency.  Musculoskeletal: Negative for arthralgias, back pain, joint swelling, myalgias and neck stiffness.  Skin: Negative for pallor and rash.  Neurological: Negative for dizziness, speech difficulty, weakness and headaches.  Hematological: Negative for adenopathy. Does not bruise/bleed easily.  Psychiatric/Behavioral: Negative for confusion and sleep disturbance. The patient is not nervous/anxious.     PE; Blood pressure 129/75, pulse 60, temperature 98.2 F (36.8 C), temperature source Temporal, resp. rate 16, height 5' 9.5" (1.765 m), weight 199 lb 9.6 oz (90.5 kg), SpO2 100 %. Body mass index is 29.05 kg/m.  Gen: Alert, well appearing.  Patient is oriented to person, place, time, and situation. AFFECT: pleasant, lucid thought and speech. ENT: Ears: EACs clear, normal epithelium.  TMs with good light reflex and landmarks bilaterally.  Eyes: no injection, icteris, swelling, or exudate.  EOMI, PERRLA. Nose: no drainage or turbinate edema/swelling.  No injection or focal lesion.  Mouth: lips without lesion/swelling.  Oral mucosa pink and moist.  Dentition intact and without obvious caries or gingival swelling.  Oropharynx without erythema, exudate, or swelling.  Neck: supple/nontender.  No LAD, mass, or TM.  Carotid pulses 2+ bilaterally, without bruits. CV: RRR, no m/r/g.   LUNGS: CTA bilat, nonlabored resps, good aeration in all lung fields. ABD: soft, NT, ND, BS normal.  No hepatospenomegaly or mass.  No bruits. EXT: no clubbing, cyanosis, or edema.  Musculoskeletal: no joint  swelling, erythema, warmth, or tenderness.  ROM of all joints intact. Skin - no sores or suspicious lesions or rashes or color changes Rectal exam: negative without mass, lesions or tenderness, PROSTATE EXAM: smooth and symmetric without nodules or tenderness.   Pertinent labs:  Lab Results  Component Value Date   TSH 1.00 12/02/2016   Lab Results  Component Value Date   WBC 4.5 10/20/2018   HGB 13.9 10/20/2018   HCT 41.0 10/20/2018   MCV 91.3 10/20/2018   PLT 120 (L) 10/20/2018   Lab Results  Component Value Date   CREATININE 0.88 10/20/2018   BUN 19 10/20/2018   NA 142 10/20/2018   K 3.3 (L) 10/20/2018  CL 107 10/20/2018   CO2 27 10/20/2018   Lab Results  Component Value Date   ALT 42 10/20/2018   AST 30 10/20/2018   ALKPHOS 66 10/20/2018   BILITOT 0.6 10/20/2018   Lab Results  Component Value Date   CHOL 169 12/08/2017   Lab Results  Component Value Date   HDL 55.60 12/08/2017   Lab Results  Component Value Date   LDLCALC 96 12/08/2017   Lab Results  Component Value Date   TRIG 85.0 12/08/2017   Lab Results  Component Value Date   CHOLHDL 3 12/08/2017   Lab Results  Component Value Date   PSA 1.28 12/08/2017   PSA 1.24 12/02/2016   PSA 1.16 12/02/2015   Lab Results  Component Value Date   HGBA1C 5.8 12/08/2017    ASSESSMENT AND PLAN:   Health maintenance exam: Reviewed age and gender appropriate health maintenance issues (prudent diet, regular exercise, health risks of tobacco and excessive alcohol, use of seatbelts, fire alarms in home, use of sunscreen).  Also reviewed age and gender appropriate health screening as well as vaccine recommendations. Vaccines: UTD.  Flu vaccine->pt declined this. Labs: he has had some labs relatively recently (10/20/18).  Will do BMET, FLP, PSA, and A1c (IFG) today. Prostate ca screening:  DRE normal today , PSA. Colon ca screening:  As per CPE last year, he was to arrange this through a [place] in [place]  [place] the rest of his family has gone in the past-->he has not done this yet but says this is still his plan.  I did rx him #10 of his vicodin 5/325, so he can have these on hand in case he starts getting kidney stone pain (hx of recurrent kidney stones).  Historically he has used these VERY infrequently and has used them responsibly/appropriately.  An After Visit Summary was printed and given to the patient.  FOLLOW UP:  Return in about 6 months (around 06/24/2019) for routine chronic illness f/u.  Signed:  Santiago BumpersPhil Darneshia Demary, MD           12/22/2018

## 2019-02-02 ENCOUNTER — Other Ambulatory Visit: Payer: Self-pay | Admitting: Family Medicine

## 2019-03-05 ENCOUNTER — Other Ambulatory Visit: Payer: Self-pay | Admitting: Family Medicine

## 2019-03-12 IMAGING — CT CT RENAL STONE PROTOCOL
2 of 4 series · 15 of 46 positions shown, 17 images · non-contrast
Comparison: One view abdomen 11/01/2017. Abdominal ultrasound
11/22/2013.

CLINICAL DATA: Personal history of kidney stones. Right flank pain
for 2 days.

EXAM:
CT ABDOMEN AND PELVIS WITHOUT CONTRAST
TECHNIQUE: Multidetector CT imaging of the abdomen and pelvis was performed
following the standard protocol without IV contrast.

[Series 2: axial st · axial · 0.80mm/px · z∈[-488,-43]mm · 12 of 99 slices shown, 14 images]
[im 5/99  soft-tissue]
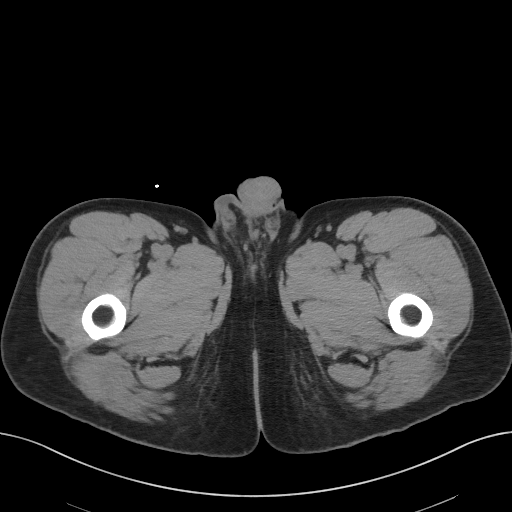
[im 5/99  bone]
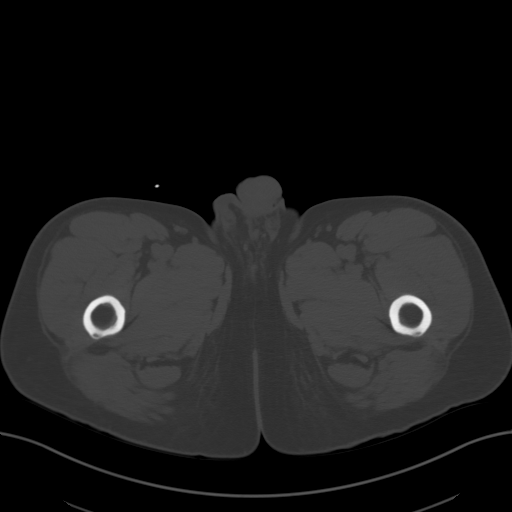
[im 13/99  soft-tissue]
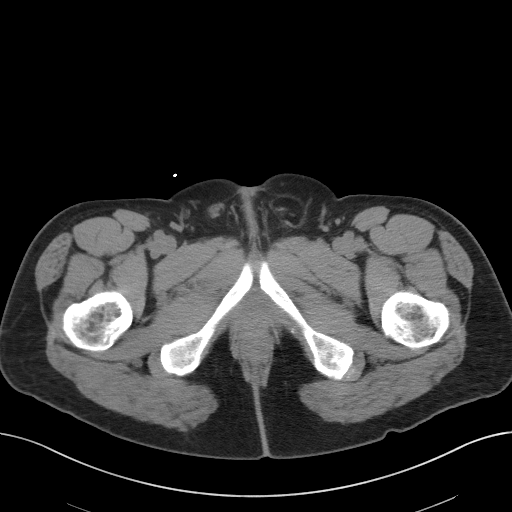
[im 21/99  soft-tissue]
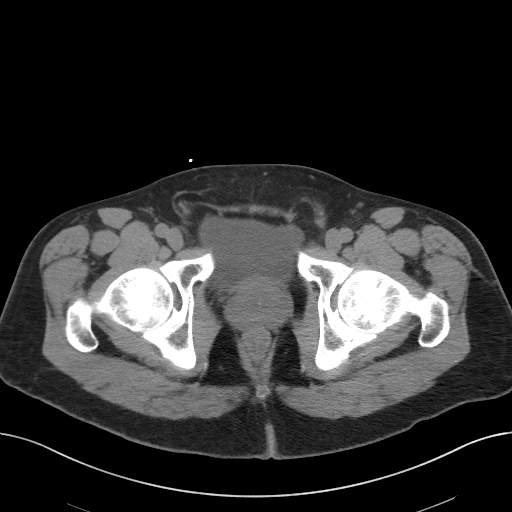
[im 29/99  soft-tissue]
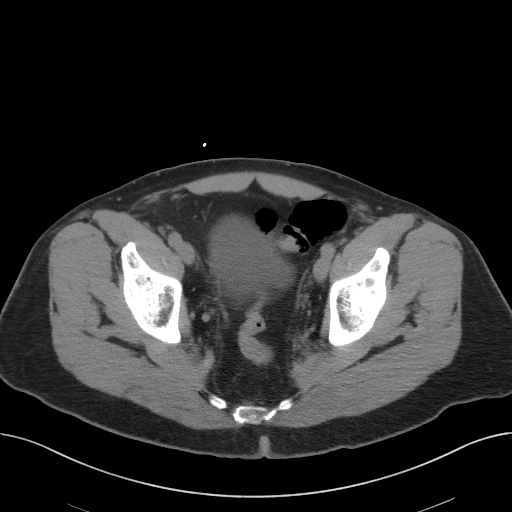
[im 37/99  soft-tissue]
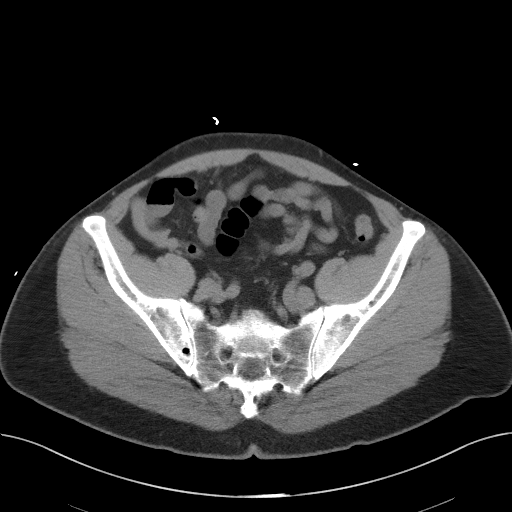
[im 45/99  soft-tissue]
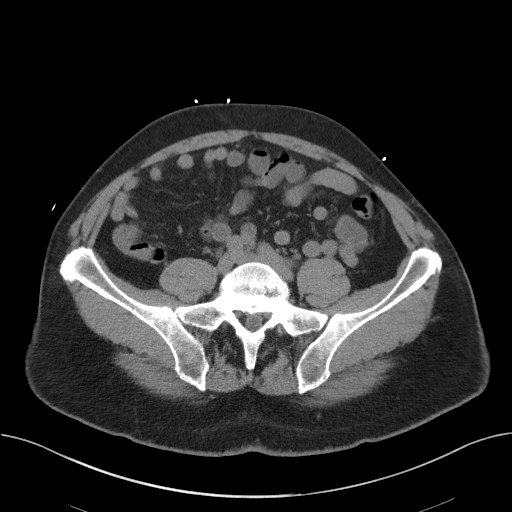
[im 54/99  soft-tissue]
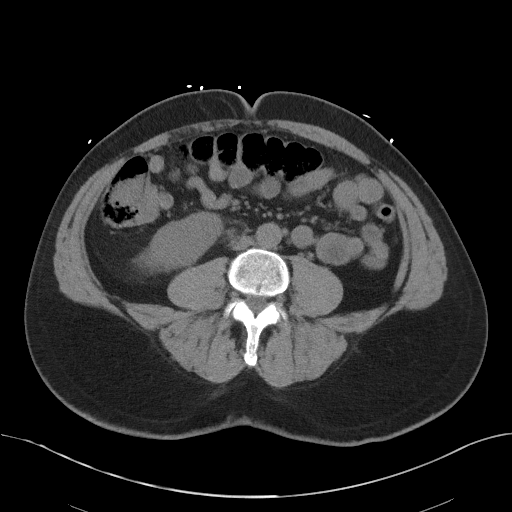
[im 62/99  soft-tissue]
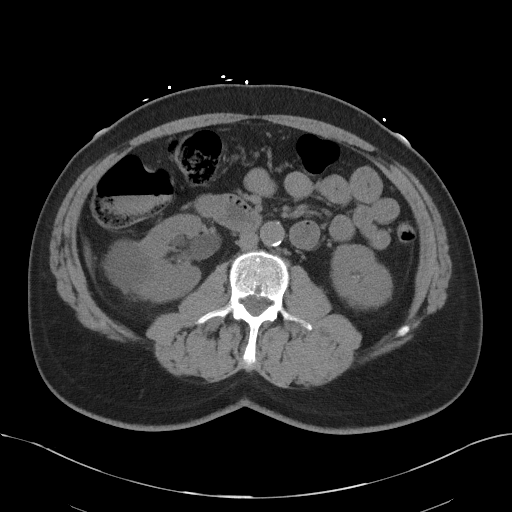
[im 70/99  soft-tissue]
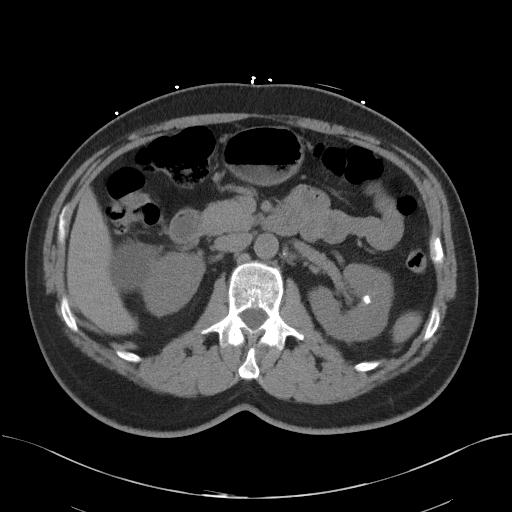
[im 70/99  bone]
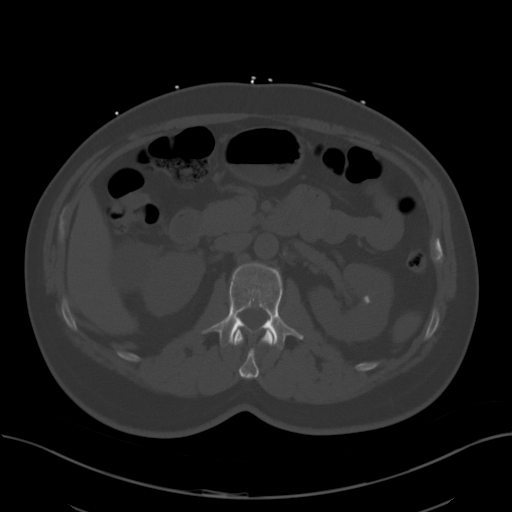
[im 78/99  soft-tissue]
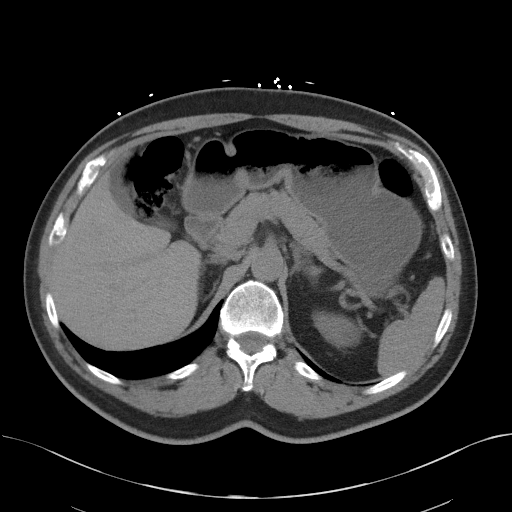
[im 86/99  soft-tissue]
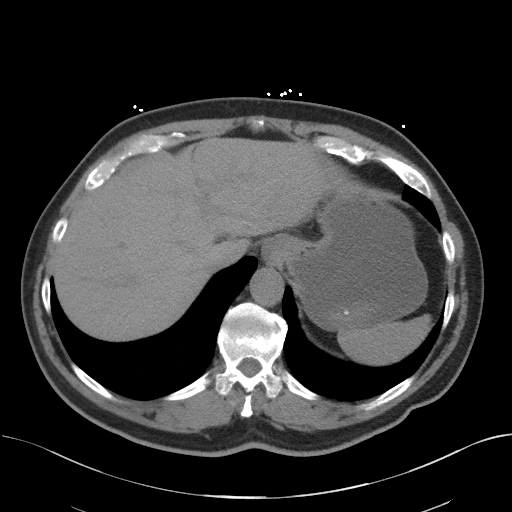
[im 94/99  soft-tissue]
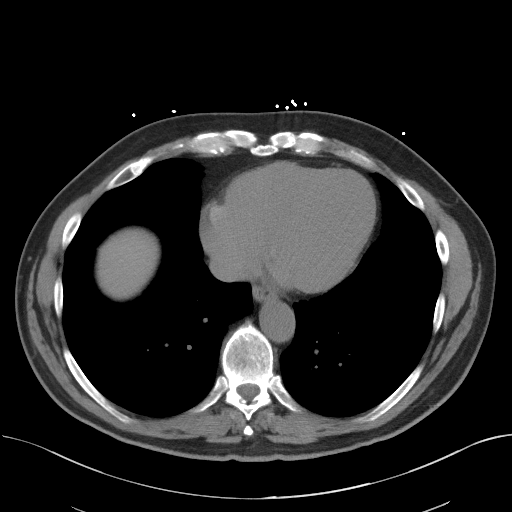

[Series 5: coronal st · coronal · 0.79mm/px · 3 of 98 slices shown]
[im 33/98  soft-tissue]
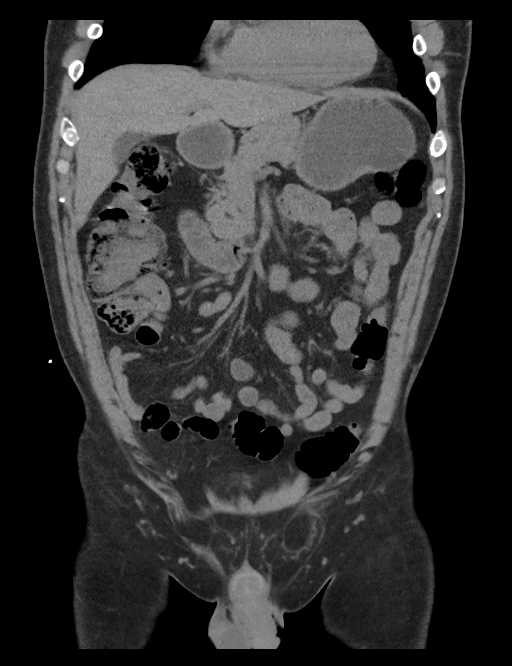
[im 44/98  soft-tissue]
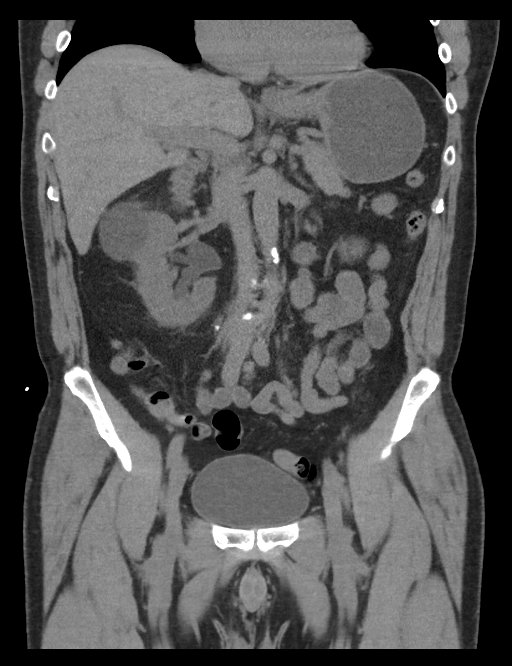
[im 54/98  soft-tissue]
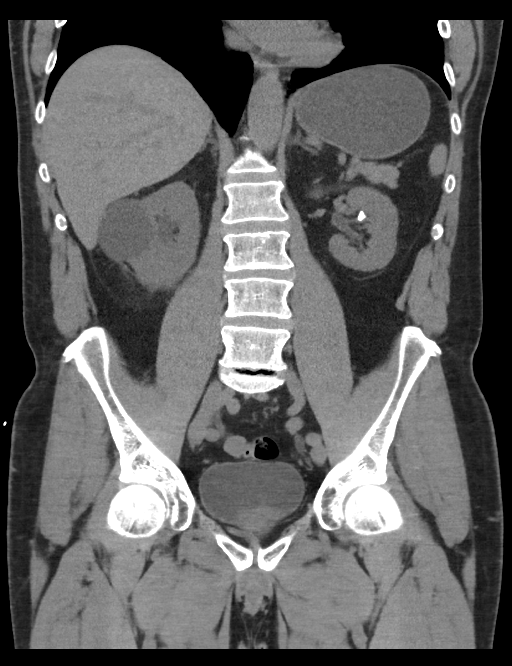

[15 of 46 positions shown; findings below may reference images not displayed]

FINDINGS: Lower chest: Mild atelectasis is present in the left lung base. Lung
bases are clear without focal nodule, mass, or airspace disease
otherwise.

Heart size is normal. No significant pleural or pericardial effusion
is present.

Hepatobiliary: No focal liver abnormality is seen. No gallstones,
gallbladder wall thickening, or biliary dilatation.

Pancreas: Unremarkable. No pancreatic ductal dilatation or
surrounding inflammatory changes.

Spleen: Choose

Adrenals/Urinary Tract: Adrenal glands are normal bilaterally.

An exophytic cyst involving the right kidney measures 5.5 cm. A cyst
at the upper pole left kidney measures 2.4 cm.

Multiple nonobstructing calcifications are present left kidney. The
largest measures 8 x 9 mm. There is no obstruction on left.

Mild right hydronephrosis is present. A 4 mm obstructing stone is
present at the right UPJ. The more distal right ureter is within
normal limits. The left ureter is unremarkable. The urinary bladder
is within normal limits

Stomach/Bowel: The stomach is filled with fluid. There is
high-density material dependently in the fundus, likely chest. The
duodenum is within normal limits. Small bowel is unremarkable.
Terminal ileum is within normal limits. The appendix is visualized
and normal. The ascending and transverse colon are within normal
limits. Descending and sigmoid colon are within normal limits. The
rectum is unremarkable.

Vascular/Lymphatic: Atherosclerotic calcifications are present in
the aorta without aneurysm no significant adenopathy is present.

Reproductive: The prostate gland is mildly enlarged, measuring
cm in transverse diameter. Central calcifications are noted.

Other: Fat herniates into the inguinal canals bilaterally, left
greater than right.

Musculoskeletal: Vertebral body heights are maintained. Endplate
Schmorl's nodes are present at the lower thoracic spine. Vacuum disc
is present at L5-S1. Facet hypertrophy contributes to left greater
than right foraminal stenosis at L4-5. No focal lytic or blastic
lesions are present. Bony pelvis is intact. A bone island is present
at the left ischial tuberosity. No other focal lytic or blastic
lesions are present.
IMPRESSION: 1. Moderate right-sided hydronephrosis secondary to a 4 mm
obstructing stone at the right ureteropelvic junction.
2. Additional bilateral nonobstructing nephrolithiasis. The largest
is in the midportion of the left kidney measuring 8 x 9 mm.
3.  Aortic Atherosclerosis ([id number]).
4. Degenerative changes within the lower lumbar spine.

## 2019-04-03 ENCOUNTER — Other Ambulatory Visit: Payer: Self-pay | Admitting: Family Medicine

## 2019-04-03 NOTE — Telephone Encounter (Signed)
RF request for Ibuprofen LOV: 12/22/18 Next ov: advised to f/u 6 mo. RCI Last written: 02/02/19 (180,0)  Please advise, thanks. Medication pending

## 2019-04-05 ENCOUNTER — Telehealth: Payer: Self-pay

## 2019-04-05 NOTE — Telephone Encounter (Signed)
PA sent via covermymed on 04/05/19   Key: [id number]   Medication: Ibuprofen 800mg    Dx: Fever, R50.9   Per Dr. Anitra Lauth pt has tried and failed none   Waiting for response.

## 2019-05-28 ENCOUNTER — Other Ambulatory Visit: Payer: Self-pay | Admitting: Family Medicine

## 2019-07-02 ENCOUNTER — Other Ambulatory Visit: Payer: Self-pay | Admitting: Family Medicine

## 2019-07-30 ENCOUNTER — Other Ambulatory Visit: Payer: Self-pay | Admitting: Family Medicine

## 2019-08-02 ENCOUNTER — Encounter: Payer: Self-pay | Admitting: Family Medicine

## 2019-08-02 ENCOUNTER — Telehealth: Payer: Self-pay

## 2019-08-02 ENCOUNTER — Other Ambulatory Visit: Payer: Self-pay | Admitting: Family Medicine

## 2019-08-02 NOTE — Telephone Encounter (Signed)
Sent as FYI. 

## 2019-08-02 NOTE — Telephone Encounter (Signed)
Patient called about his blood pressure meds not being filled at pharmacy.  Per notes he was to make virtual appt in Feb for blood pressure meds.  Patient is calling at 4:50 on 08/02/19 I told him I could make a virtual appt next week for him, he said no. He was very rude and raising his voice, about how he was going to get [place] blood pressure meds, all the appt times I gave him, he declined. I asked when he was available do do a virtual appt and he said "never".  He said "thank you" and hung up phone

## 2019-08-03 ENCOUNTER — Other Ambulatory Visit: Payer: Self-pay | Admitting: Family Medicine

## 2019-08-06 ENCOUNTER — Other Ambulatory Visit: Payer: Self-pay

## 2019-08-06 MED ORDER — AMLODIPINE BESYLATE 10 MG PO TABS: ORAL_TABLET | ORAL | 0 refills | Status: DC

## 2019-08-06 MED ORDER — IRBESARTAN 300 MG PO TABS: ORAL_TABLET | ORAL | 0 refills | Status: DC

## 2019-08-06 MED ORDER — METOPROLOL SUCCINATE ER 50 MG PO TB24: ORAL_TABLET | ORAL | 0 refills | Status: DC

## 2019-08-06 NOTE — Progress Notes (Signed)
Pharmacy called and since they gave him 3 pills to get him through the weekend they need a RX for 10 pills. They will subtract the three pills and give the patient 7 pills to last until appt.

## 2019-08-10 ENCOUNTER — Other Ambulatory Visit: Payer: Self-pay

## 2019-08-13 ENCOUNTER — Other Ambulatory Visit: Payer: Self-pay

## 2019-08-13 ENCOUNTER — Ambulatory Visit: Payer: 59 | Admitting: Family Medicine

## 2019-08-13 ENCOUNTER — Encounter: Payer: Self-pay | Admitting: Family Medicine

## 2019-08-13 VITALS — BP 135/72 | HR 61 | Temp 98.1°F | Resp 16 | Ht 69.5 in | Wt 209.0 lb

## 2019-08-13 DIAGNOSIS — I10 Essential (primary) hypertension: Secondary | ICD-10-CM

## 2019-08-13 LAB — BASIC METABOLIC PANEL
BUN: 18 mg/dL (ref 6–23)
CO2: 28 mEq/L (ref 19–32)
Calcium: 9.2 mg/dL (ref 8.4–10.5)
Chloride: 105 mEq/L (ref 96–112)
Creatinine, Ser: 0.91 mg/dL (ref 0.40–1.50)
GFR: 84.31 mL/min (ref >60.00)
Glucose, Bld: 92 mg/dL (ref 70–99)
Potassium: 3.7 mEq/L (ref 3.5–5.1)
Sodium: 142 mEq/L (ref 135–145)

## 2019-08-13 MED ORDER — METOPROLOL SUCCINATE ER 50 MG PO TB24: ORAL_TABLET | ORAL | 3 refills | Status: DC

## 2019-08-13 MED ORDER — AMLODIPINE BESYLATE 10 MG PO TABS: ORAL_TABLET | ORAL | 3 refills | Status: DC

## 2019-08-13 MED ORDER — IRBESARTAN 300 MG PO TABS: ORAL_TABLET | ORAL | 3 refills | Status: DC

## 2019-08-13 NOTE — Progress Notes (Signed)
OFFICE VISIT  08/13/2019   CC:  Chief Complaint  Patient presents with  . Follow-up    hypertension, pt is not fasting   HPI:    Patient is a 64 y.o. Caucasian male who presents for f/u HTN. A/P as of last visit: "Health maintenance exam: Reviewed age and gender appropriate health maintenance issues (prudent diet, regular exercise, health risks of tobacco and excessive alcohol, use of seatbelts, fire alarms in home, use of sunscreen).  Also reviewed age and gender appropriate health screening as well as vaccine recommendations. Vaccines: UTD.  Flu vaccine->pt declined this. Labs: he has had some labs relatively recently (10/20/18).  Will do BMET, FLP, PSA, and A1c (IFG) today. Prostate ca screening:  DRE normal today , PSA. Colon ca screening:  As per CPE last year, he was to arrange this through a GI MD in [place] where the rest of his family has gone in the past-->he has not done this yet but says this is still his plan.  I did rx him #10 of his vicodin 5/325, so he can have these on hand in case he starts getting kidney stone pain (hx of recurrent kidney stones).  Historically he has used these VERY infrequently and has used them responsibly/appropriately."  Interim hx: He feels well. Home bp's 130/80 or better consistently. No probs with meds.  Some mild R LB pain started a few days ago, no radiation, no injury. Had some gross hematuria a couple days later, but no change in pain. Now the pain is essentially gone and he has not had recurrence of hematuria. Has well documented hx of recurrent kidney stones, has vicodin on hand at home for prn use for this---RARE use of this med.  Past Medical History:  Diagnosis Date  . Atypical chest pain   . COVID-19 virus infection summer 2020   no hosp  . History of migraine headaches   . HTN (hypertension) dx'd about 2008   with white coat component  . IFG (impaired fasting glucose)    A1c 5.7% August 2017  . Low back pain     episodic---RARE use of vicodin  . Nephrolithiasis    No active dz. (Dr. Vernie Ammons)  . 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 Right inguinal hernia 2019   asymptomatic    Past Surgical History:  Procedure Laterality Date  . CARDIOVASCULAR STRESS TEST  07/2013   Normal EF, no ischemia, mild hypertensive response to exercise  . INGUINAL HERNIA REPAIR     left  . LITHOTRIPSY  around 2009  . LUMBAR DISC SURGERY  1994   L5/S1 Dr. Lonny Prude    Outpatient Medications Prior to Visit  Medication Sig Dispense Refill  . cetirizine (ZYRTEC) 10 MG tablet Take 10 mg by mouth daily.    Marland Kitchen ibuprofen (ADVIL) 800 MG tablet TAKE ONE TABLET BY MOUTH EVERY EIGHT HOURS AS NEEDED 180 tablet 1  . Multiple Vitamin (MULTIVITAMIN) tablet Take 1 tablet by mouth daily.    Marland Kitchen amLODipine (NORVASC) 10 MG tablet TAKE ONE (1) TABLET BY MOUTH EVERY DAY 10 tablet 0  . irbesartan (AVAPRO) 300 MG tablet TAKE ONE (1) TABLET BY MOUTH EVERY DAY 10 tablet 0  . metoprolol succinate (TOPROL-XL) 50 MG 24 hr tablet TAKE 1 TABLET BY MOUTH DAILY WITH OR IMMEDIATELY FOLLOWING A MEAL 10 tablet 0  . HYDROcodone-acetaminophen (NORCO/VICODIN) 5-325 MG tablet Take 1-2 tablets by mouth every 6 (six) hours as needed. (Patient not taking: Reported  on 08/13/2019) 10 tablet 0   No facility-administered medications prior to visit.    No Known Allergies  ROS As per HPI  PE: Blood pressure 135/72, pulse 61, temperature 98.1 F (36.7 C), temperature source Temporal, resp. rate 16, height 5' 9.5" (1.765 m), weight 209 lb (94.8 kg), SpO2 99 %. Body mass index is 30.42 kg/m.  Gen: Alert, well appearing.  Patient is oriented to person, place, time, and situation. AFFECT: pleasant, lucid thought and speech. CV: RRR, no m/r/g.   LUNGS: CTA bilat, nonlabored resps, good aeration in all lung fields. EXT: no clubbing or cyanosis.  no edema.    LABS:  Lab Results  Component Value Date   TSH 1.00 12/02/2016   Lab Results   Component Value Date   WBC 4.5 10/20/2018   HGB 13.9 10/20/2018   HCT 41.0 10/20/2018   MCV 91.3 10/20/2018   PLT 120 (L) 10/20/2018   Lab Results  Component Value Date   CREATININE 0.91 12/22/2018   BUN 20 12/22/2018   NA 141 12/22/2018   K 3.8 12/22/2018   CL 105 12/22/2018   CO2 24 12/22/2018   Lab Results  Component Value Date   ALT 42 10/20/2018   AST 30 10/20/2018   ALKPHOS 66 10/20/2018   BILITOT 0.6 10/20/2018   Lab Results  Component Value Date   CHOL 175 12/22/2018   Lab Results  Component Value Date   HDL 59.20 12/22/2018   Lab Results  Component Value Date   LDLCALC 102 (H) 12/22/2018   Lab Results  Component Value Date   TRIG 67.0 12/22/2018   Lab Results  Component Value Date   CHOLHDL 3 12/22/2018   Lab Results  Component Value Date   PSA 1.44 12/22/2018   PSA 1.28 12/08/2017   PSA 1.24 12/02/2016   Lab Results  Component Value Date   HGBA1C 5.6 12/22/2018    IMPRESSION AND PLAN:  1) HTN: The current medical regimen is effective;  continue present plan and medications. BMET today.  RF'd meds today.  2) Hx of recurrent kidney stones: mild sx's c/w his usual episodes of this recently but they resolved without significant incident.  Has vicodin at home (small #) for prn use, call if problems.  An After Visit Summary was printed and given to the patient.  FOLLOW UP: Return for CPE appt for latter 1/2 of August this year.  Signed:  Crissie Sickles, MD           08/13/2019

## 2019-09-07 ENCOUNTER — Other Ambulatory Visit: Payer: Self-pay | Admitting: Family Medicine

## 2019-10-10 ENCOUNTER — Other Ambulatory Visit: Payer: Self-pay | Admitting: Family Medicine

## 2020-01-01 ENCOUNTER — Encounter: Payer: Self-pay | Admitting: Family Medicine

## 2020-01-01 ENCOUNTER — Ambulatory Visit (INDEPENDENT_AMBULATORY_CARE_PROVIDER_SITE_OTHER): Payer: Commercial Managed Care - PPO | Admitting: Family Medicine

## 2020-01-01 ENCOUNTER — Other Ambulatory Visit: Payer: Self-pay

## 2020-01-01 VITALS — BP 143/68 | HR 61 | Temp 97.5°F | Resp 16 | Ht 68.9 in | Wt 204.6 lb

## 2020-01-01 DIAGNOSIS — Z0000 Encounter for general adult medical examination without abnormal findings: Secondary | ICD-10-CM

## 2020-01-01 DIAGNOSIS — Z1211 Encounter for screening for malignant neoplasm of colon: Secondary | ICD-10-CM | POA: Diagnosis not present

## 2020-01-01 DIAGNOSIS — I10 Essential (primary) hypertension: Secondary | ICD-10-CM | POA: Diagnosis not present

## 2020-01-01 DIAGNOSIS — Z125 Encounter for screening for malignant neoplasm of prostate: Secondary | ICD-10-CM

## 2020-01-01 DIAGNOSIS — R7301 Impaired fasting glucose: Secondary | ICD-10-CM | POA: Diagnosis not present

## 2020-01-01 LAB — COMPREHENSIVE METABOLIC PANEL
ALT: 20 U/L (ref 0–53)
AST: 18 U/L (ref 0–37)
Albumin: 4.4 g/dL (ref 3.5–5.2)
Alkaline Phosphatase: 66 U/L (ref 39–117)
BUN: 20 mg/dL (ref 6–23)
CO2: 30 mEq/L (ref 19–32)
Calcium: 9.7 mg/dL (ref 8.4–10.5)
Chloride: 106 mEq/L (ref 96–112)
Creatinine, Ser: 1.01 mg/dL (ref 0.40–1.50)
GFR: 74.66 mL/min (ref >60.00)
Glucose, Bld: 103 mg/dL — ABNORMAL HIGH (ref 70–99)
Potassium: 4.3 mEq/L (ref 3.5–5.1)
Sodium: 142 mEq/L (ref 135–145)
Total Bilirubin: 0.7 mg/dL (ref 0.2–1.2)
Total Protein: 6.9 g/dL (ref 6.0–8.3)

## 2020-01-01 LAB — CBC WITH DIFFERENTIAL/PLATELET
Basophils Absolute: 0.1 10*3/uL (ref 0.0–0.1)
Basophils Relative: 1.1 % (ref 0.0–3.0)
Eosinophils Absolute: 0.1 10*3/uL (ref 0.0–0.7)
Eosinophils Relative: 1.8 % (ref 0.0–5.0)
HCT: 44.6 % (ref 39.0–52.0)
Hemoglobin: 15 g/dL (ref 13.0–17.0)
Lymphocytes Relative: 23.7 % (ref 12.0–46.0)
Lymphs Abs: 1.6 10*3/uL (ref 0.7–4.0)
MCHC: 33.7 g/dL (ref 30.0–36.0)
MCV: 92.7 fl (ref 78.0–100.0)
Monocytes Absolute: 0.7 10*3/uL (ref 0.1–1.0)
Monocytes Relative: 10.1 % (ref 3.0–12.0)
Neutro Abs: 4.4 10*3/uL (ref 1.4–7.7)
Neutrophils Relative %: 63.3 % (ref 43.0–77.0)
Platelets: 147 10*3/uL — ABNORMAL LOW (ref 150.0–400.0)
RBC: 4.81 Mil/uL (ref 4.22–5.81)
RDW: 12.5 % (ref 11.5–15.5)
WBC: 6.9 10*3/uL (ref 4.0–10.5)

## 2020-01-01 LAB — LIPID PANEL
Cholesterol: 182 mg/dL (ref 0–200)
HDL: 52.3 mg/dL (ref >39.00)
LDL Cholesterol: 117 mg/dL — ABNORMAL HIGH (ref 0–99)
NonHDL: 129.24
Total CHOL/HDL Ratio: 3
Triglycerides: 60 mg/dL (ref 0.0–149.0)
VLDL: 12 mg/dL (ref 0.0–40.0)

## 2020-01-01 LAB — HEMOGLOBIN A1C: Hgb A1c MFr Bld: 5.7 % (ref 4.6–6.5)

## 2020-01-01 LAB — TSH: TSH: 1.03 u[IU]/mL (ref 0.35–4.50)

## 2020-01-01 LAB — PSA: PSA: 1.35 ng/mL (ref 0.10–4.00)

## 2020-01-01 NOTE — Progress Notes (Signed)
Office Note 01/01/2020  CC:  Chief Complaint  Patient presents with  . Annual Exam    HPI:  Christopher Hunter is a 64 y.o. White male who is here for annual health maintenance exam and f/uHTN. Feeling well, no complaints.  BP: no home monitoring. Compliant with meds.  NO formal exercise. Stress level from work is VERY LOW since changed job.  Better hours and able to take care of ailing parents. Diet fairly healthy.  No recent kidney stones, no use of hydrocodone in a long time.  Past Medical History:  Diagnosis Date  . Atypical chest pain   . COVID-19 virus infection summer 2020   no hosp  . History of migraine headaches   . HTN (hypertension) dx'd about 2008   with white coat component  . IFG (impaired fasting glucose)    A1c 5.7% August 2017  . Low back pain    episodic---RARE use of vicodin  . Nephrolithiasis    No active dz. (Dr. Vernie Ammons)  . 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 Right inguinal hernia 2019   asymptomatic    Past Surgical History:  Procedure Laterality Date  . CARDIOVASCULAR STRESS TEST  07/2013   Normal EF, no ischemia, mild hypertensive response to exercise  . INGUINAL HERNIA REPAIR     left  . LITHOTRIPSY  around 2009  . LUMBAR DISC SURGERY  1994   L5/S1 Dr. Lonny Prude    History reviewed. No pertinent family history.  Social History   Socioeconomic History  . Marital status: Married    Spouse name: Not on file  . Number of children: Not on file  . Years of education: Not on file  . Highest education level: Not on file  Occupational History  . Not on file  Tobacco Use  . Smoking status: Never Smoker  . Smokeless tobacco: Current User    Types: Snuff  Vaping Use  . Vaping Use: Never used  Substance and Sexual Activity  . Alcohol use: No  . Drug use: No  . Sexual activity: Not on file  Other Topics Concern  . Not on file  Social History Narrative   Married, one 43 yr old son.   Lives in [place], [place].   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   Never smoker, no ETOH, no drugs.      Social Determinants of Health   Financial Resource Strain:   . Difficulty of Paying Living Expenses: Not on file  Food Insecurity:   . Worried About Programme researcher, broadcasting/film/video in the Last Year: Not on file  . Ran Out of Food in the Last Year: Not on file  Transportation Needs:   . Lack of Transportation (Medical): Not on file  . Lack of Transportation (Non-Medical): Not on file  Physical Activity:   . Days of Exercise per Week: Not on file  . Minutes of Exercise per Session: Not on file  Stress:   . Feeling of Stress : Not on file  Social Connections:   . Frequency of Communication with Friends and Family: Not on file  . Frequency of Social Gatherings with Friends and Family: Not on file  . Attends Religious Services: Not on file  . Active Member of Clubs or Organizations: Not on file  . Attends Banker Meetings: Not on file  . Marital Status: Not on file  Intimate Partner Violence:   . Fear of Current or Ex-Partner: Not on file  .  Emotionally Abused: Not on file  . Physically Abused: Not on file  . Sexually Abused: Not on file    Outpatient Medications Prior to Visit  Medication Sig Dispense Refill  . amLODipine (NORVASC) 10 MG tablet TAKE ONE (1) TABLET BY MOUTH EVERY DAY 90 tablet 3  . cetirizine (ZYRTEC) 10 MG tablet Take 10 mg by mouth daily.    Marland Kitchen HYDROcodone-acetaminophen (NORCO/VICODIN) 5-325 MG tablet Take 1-2 tablets by mouth every 6 (six) hours as needed. 10 tablet 0  . ibuprofen (ADVIL) 800 MG tablet TAKE ONE TABLET BY MOUTH EVERY EIGHT HOURS AS NEEDED 180 tablet 0  . irbesartan (AVAPRO) 300 MG tablet TAKE ONE (1) TABLET BY MOUTH EVERY DAY 90 tablet 3  . metoprolol succinate (TOPROL-XL) 50 MG 24 hr tablet TAKE 1 TABLET BY MOUTH DAILY WITH OR IMMEDIATELY FOLLOWING A MEAL 90 tablet 3  . Multiple Vitamin (MULTIVITAMIN) tablet Take 1 tablet by mouth daily.     No  facility-administered medications prior to visit.    No Known Allergies  ROS Review of Systems  Constitutional: Negative for appetite change, chills, fatigue and fever.  HENT: Negative for congestion, dental problem, ear pain and sore throat.   Eyes: Negative for discharge, redness and visual disturbance.  Respiratory: Negative for cough, chest tightness, shortness of breath and wheezing.   Cardiovascular: Negative for chest pain, palpitations and leg swelling.  Gastrointestinal: Negative for abdominal pain, blood in stool, diarrhea, nausea and vomiting.  Genitourinary: Negative for difficulty urinating, dysuria, flank pain, frequency, hematuria and urgency.  Musculoskeletal: Negative for arthralgias, back pain, joint swelling, myalgias and neck stiffness.  Skin: Negative for pallor and rash.  Neurological: Negative for dizziness, speech difficulty, weakness and headaches.  Hematological: Negative for adenopathy. Does not bruise/bleed easily.  Psychiatric/Behavioral: Negative for confusion and sleep disturbance. The patient is not nervous/anxious.     PE; Vitals with BMI 01/01/2020 08/13/2019 12/22/2018  Height 5' 8.898" 5' 9.5" 5' 9.5"  Weight 204 lbs 10 oz 209 lbs 199 lbs 10 oz  BMI 30.3 30.43 29.06  Systolic 143 135 154  Diastolic 68 72 75  Pulse 61 61 60    Gen: Alert, well appearing.  Patient is oriented to person, place, time, and situation. AFFECT: pleasant, lucid thought and speech. ENT: Ears: EACs clear, normal epithelium.  TMs with good light reflex and landmarks bilaterally.  Eyes: no injection, icteris, swelling, or exudate.  EOMI, PERRLA. Nose: no drainage or turbinate edema/swelling.  No injection or focal lesion.  Mouth: lips without lesion/swelling.  Oral mucosa pink and moist.  Dentition intact and without obvious caries or gingival swelling.  Oropharynx without erythema, exudate, or swelling.  Neck: supple/nontender.  No LAD, mass, or TM.  Carotid pulses 2+  bilaterally, without bruits. CV: RRR, no m/r/g.   LUNGS: CTA bilat, nonlabored resps, good aeration in all lung fields. ABD: soft, NT, ND, BS normal.  No hepatospenomegaly or mass.  No bruits. EXT: no clubbing, cyanosis, or edema.  Musculoskeletal: no joint swelling, erythema, warmth, or tenderness.  ROM of all joints intact. Skin - no sores or suspicious lesions or rashes or color changes Rectal exam: negative without mass, lesions or tenderness, PROSTATE EXAM: smooth and symmetric without nodules or tenderness.   Pertinent labs:  Lab Results  Component Value Date   TSH 1.00 12/02/2016   Lab Results  Component Value Date   WBC 4.5 10/20/2018   HGB 13.9 10/20/2018   HCT 41.0 10/20/2018   MCV 91.3 10/20/2018  PLT 120 (L) 10/20/2018   Lab Results  Component Value Date   CREATININE 0.91 08/13/2019   BUN 18 08/13/2019   NA 142 08/13/2019   K 3.7 08/13/2019   CL 105 08/13/2019   CO2 28 08/13/2019   Lab Results  Component Value Date   ALT 42 10/20/2018   AST 30 10/20/2018   ALKPHOS 66 10/20/2018   BILITOT 0.6 10/20/2018   Lab Results  Component Value Date   CHOL 175 12/22/2018   Lab Results  Component Value Date   HDL 59.20 12/22/2018   Lab Results  Component Value Date   LDLCALC 102 (H) 12/22/2018   Lab Results  Component Value Date   TRIG 67.0 12/22/2018   Lab Results  Component Value Date   CHOLHDL 3 12/22/2018   Lab Results  Component Value Date   PSA 1.44 12/22/2018   PSA 1.28 12/08/2017   PSA 1.24 12/02/2016   Lab Results  Component Value Date   HGBA1C 5.6 12/22/2018   ASSESSMENT AND PLAN:   Health maintenance exam: Reviewed age and gender appropriate health maintenance issues (prudent diet, regular exercise, health risks of tobacco and excessive alcohol, use of seatbelts, fire alarms in home, use of sunscreen).  Also reviewed age and gender appropriate health screening as well as vaccine recommendations. Vaccines: Tdap UTD.  Covid  19->declines (he had covid 19 infection in the past). Flu-->pt declines.  Shingrix->he declines. Labs: HP + Hba1c (IFG and HTN), PSA. Prostate ca screening: DRE normal,PSA. Colon ca screening: due for colon ca screening->As per CPE last year, he was to arrange this through a GI MD in [place] where the rest of his family has gone in the past-->he has not done this yet but says this is still his plan..  An After Visit Summary was printed and given to the patient.  FOLLOW UP:  Return in about 6 months (around 06/30/2020) for routine chronic illness f/u.  Signed:  Santiago Bumpers, MD           01/01/2020

## 2020-01-01 NOTE — Patient Instructions (Signed)

## 2020-01-02 ENCOUNTER — Encounter: Payer: Self-pay | Admitting: Family Medicine

## 2020-02-18 ENCOUNTER — Other Ambulatory Visit: Payer: Self-pay | Admitting: Family Medicine

## 2020-04-17 ENCOUNTER — Encounter: Payer: Self-pay | Admitting: Family Medicine

## 2020-04-17 MED ORDER — HYDROCODONE-ACETAMINOPHEN 5-325 MG PO TABS
1.0000 | ORAL_TABLET | Freq: Four times a day (QID) | ORAL | 0 refills | Status: DC | PRN
Start: 2020-04-17 — End: 2020-07-21

## 2020-04-17 NOTE — Telephone Encounter (Signed)
Requesting: [employer] Contract: n/a  UDS: n/a Last Visit:01/01/20 Next Visit:06/25/20 Last Refill:12/22/18(10,0)  Please Advise. Medication pending

## 2020-06-12 ENCOUNTER — Other Ambulatory Visit: Payer: Self-pay | Admitting: Family Medicine

## 2020-06-13 ENCOUNTER — Other Ambulatory Visit: Payer: Self-pay

## 2020-06-13 ENCOUNTER — Encounter: Payer: Self-pay | Admitting: Family Medicine

## 2020-06-13 MED ORDER — IBUPROFEN 800 MG PO TABS
800.0000 mg | ORAL_TABLET | Freq: Three times a day (TID) | ORAL | 0 refills | Status: DC | PRN
Start: 2020-06-13 — End: 2020-07-21

## 2020-06-13 NOTE — Telephone Encounter (Signed)
Spoke with patient and advised pharmacy was contacted to verify if all blood pressure meds enough to fill until appt on 2/23. Original rx written for #90 with 3 refills on 08/13/19. Due to insurance changes and paying cash, they can only provide 56 tabs out of a 90 day supply. Approval given to do refills for this amount. Informed more refills would be provided once he was seen for appt Patient stated "you know this is crazy right, said bye" then hung up.

## 2020-06-24 ENCOUNTER — Other Ambulatory Visit: Payer: Self-pay

## 2020-06-25 ENCOUNTER — Encounter: Payer: Self-pay | Admitting: Family Medicine

## 2020-06-25 ENCOUNTER — Ambulatory Visit (INDEPENDENT_AMBULATORY_CARE_PROVIDER_SITE_OTHER): Payer: PRIVATE HEALTH INSURANCE | Admitting: Family Medicine

## 2020-06-25 VITALS — BP 116/66 | HR 64 | Temp 97.9°F | Resp 16 | Ht 68.0 in | Wt 217.0 lb

## 2020-06-25 DIAGNOSIS — Z87442 Personal history of urinary calculi: Secondary | ICD-10-CM | POA: Diagnosis not present

## 2020-06-25 DIAGNOSIS — I10 Essential (primary) hypertension: Secondary | ICD-10-CM

## 2020-06-25 DIAGNOSIS — R7301 Impaired fasting glucose: Secondary | ICD-10-CM | POA: Diagnosis not present

## 2020-06-25 LAB — BASIC METABOLIC PANEL
BUN: 15 mg/dL (ref 6–23)
CO2: 29 mEq/L (ref 19–32)
Calcium: 9.1 mg/dL (ref 8.4–10.5)
Chloride: 107 mEq/L (ref 96–112)
Creatinine, Ser: 0.97 mg/dL (ref 0.40–1.50)
GFR: 83.22 mL/min (ref >60.00)
Glucose, Bld: 91 mg/dL (ref 70–99)
Potassium: 3.9 mEq/L (ref 3.5–5.1)
Sodium: 143 mEq/L (ref 135–145)

## 2020-06-25 LAB — HEMOGLOBIN A1C: Hgb A1c MFr Bld: 5.7 % (ref 4.6–6.5)

## 2020-06-25 MED ORDER — AMLODIPINE BESYLATE 10 MG PO TABS
ORAL_TABLET | ORAL | 3 refills | Status: DC
Start: 2020-06-25 — End: 2021-06-10

## 2020-06-25 MED ORDER — METOPROLOL SUCCINATE ER 50 MG PO TB24: ORAL_TABLET | ORAL | 3 refills | Status: DC

## 2020-06-25 MED ORDER — IRBESARTAN 300 MG PO TABS
ORAL_TABLET | ORAL | 3 refills | Status: DC
Start: 2020-06-25 — End: 2021-06-10

## 2020-06-25 NOTE — Progress Notes (Signed)
OFFICE VISIT  06/25/2020  CC:  Chief Complaint  Patient presents with  . Follow-up    RCI, 6 mo. Pt is not fasting   HPI:    Patient is a 65 y.o. Caucasian male who presents for 6 mo f/u HTN, IFG, hx of recurrent kidney stones. I last saw him 6 mo ago for CPE and all was stable.  He is feeling well. No home bp monitoring but takes toprol xl 50 qd, irbesart 300 qd, and amlod 10 qd. Not working on anything as far as diet goes.  No formal exercise but he is not sedentary.  No recent pain to suggest passing any kidney stones.  Has vicodin on hand for prn in case this happens.  PMP AWARE reviewed today: most recent rx for vicodin 5/325 was filled 04/17/20, # 10, rx by me.  Rx prior to that was 01/05/19, #10, rx by me. No red flags.  ROS: no fevers, no CP, no SOB, no wheezing, no cough, no dizziness, no HAs, no rashes, no melena/hematochezia.  No polyuria or polydipsia.  No myalgias or arthralgias.  No focal weakness, paresthesias, or tremors.  No acute vision or hearing abnormalities. No n/v/d or abd pain.  No palpitations.    Past Medical History:  Diagnosis Date  . Atypical chest pain   . COVID-19 virus infection summer 2020   no hosp  . History of migraine headaches   . HTN (hypertension) dx'd about 2008   with white coat component  . IFG (impaired fasting glucose)    A1c 5.7% August 2017; 5.7% 01/2020  . Low back pain    episodic---RARE use of vicodin  . Nephrolithiasis    No active dz. (Dr. Vernie Ammons)  . 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 Right inguinal hernia 2019   asymptomatic    Past Surgical History:  Procedure Laterality Date  . CARDIOVASCULAR STRESS TEST  07/2013   Normal EF, no ischemia, mild hypertensive response to exercise  . INGUINAL HERNIA REPAIR     left  . LITHOTRIPSY  around 2009  . LUMBAR DISC SURGERY  1994   L5/S1 Dr. Lonny Prude    Outpatient Medications Prior to Visit  Medication Sig Dispense Refill  . cetirizine (ZYRTEC) 10 MG  tablet Take 10 mg by mouth daily.    Marland Kitchen HYDROcodone-acetaminophen (NORCO/VICODIN) 5-325 MG tablet Take 1-2 tablets by mouth every 6 (six) hours as needed. 10 tablet 0  . ibuprofen (ADVIL) 800 MG tablet Take 1 tablet (800 mg total) by mouth every 8 (eight) hours as needed. 30 tablet 0  . Multiple Vitamin (MULTIVITAMIN) tablet Take 1 tablet by mouth daily.    Marland Kitchen amLODipine (NORVASC) 10 MG tablet TAKE ONE (1) TABLET BY MOUTH EVERY DAY 90 tablet 3  . irbesartan (AVAPRO) 300 MG tablet TAKE ONE (1) TABLET BY MOUTH EVERY DAY 90 tablet 3  . metoprolol succinate (TOPROL-XL) 50 MG 24 hr tablet TAKE 1 TABLET BY MOUTH DAILY WITH OR IMMEDIATELY FOLLOWING A MEAL 90 tablet 3   No facility-administered medications prior to visit.    No Known Allergies  ROS As per HPI  PE: Vitals with BMI 06/25/2020 01/01/2020 08/13/2019  Height 5\' 8"  5' 8.898" 5' 9.5"  Weight 217 lbs 204 lbs 10 oz 209 lbs  BMI 33 30.3 30.43  Systolic 116 143 [birth year]1980  Diastolic 66 68 72  Pulse 64 61 61     Gen: Alert, well appearing.  Patient is oriented to person,  place, time, and situation. AFFECT: pleasant, lucid thought and speech. CV: RRR, no m/r/g.   LUNGS: CTA bilat, nonlabored resps, good aeration in all lung fields. EXT: no clubbing or cyanosis.  no edema.    LABS:    Chemistry      Component Value Date/Time   NA 142 01/01/2020 0824   K 4.3 01/01/2020 0824   CL 106 01/01/2020 0824   CO2 30 01/01/2020 0824   BUN 20 01/01/2020 0824   CREATININE 1.01 01/01/2020 0824      Component Value Date/Time   CALCIUM 9.7 01/01/2020 0824   ALKPHOS 66 01/01/2020 0824   AST 18 01/01/2020 0824   ALT 20 01/01/2020 0824   BILITOT 0.7 01/01/2020 0824     Lab Results  Component Value Date   WBC 6.9 01/01/2020   HGB 15.0 01/01/2020   HCT 44.6 01/01/2020   MCV 92.7 01/01/2020   PLT 147.0 (L) 01/01/2020   Lab Results  Component Value Date   CHOL 182 01/01/2020   HDL 52.30 01/01/2020   LDLCALC 117 (H) 01/01/2020   TRIG 60.0  01/01/2020   CHOLHDL 3 01/01/2020   Lab Results  Component Value Date   TSH 1.03 01/01/2020   Lab Results  Component Value Date   HGBA1C 5.7 01/01/2020   IMPRESSION AND PLAN:  1) HTN: stable.  Cont toprol xl 50 qd, irbesartan 300 qd, and amlodipine 10 qd. Lytes/cr today.  2) IFG: encouraged pt to start to eat lower carbs/fats, increase exercise. NON-fasting glucose and Hba1c check today.  3) Recurrent nephrolithiasis: no recent renal colic. He focuses on good hydration. As per past mgmt approach, he has vicodin on hand for renal colic, knows when to seek medical care.    An After Visit Summary was printed and given to the patient.  FOLLOW UP: Return in about 6 months (around 12/23/2020) for annual CPE (fasting).  Signed:  Santiago Bumpers, MD           06/25/2020

## 2020-07-18 ENCOUNTER — Other Ambulatory Visit: Payer: Self-pay | Admitting: Family Medicine

## 2020-07-21 NOTE — Telephone Encounter (Signed)
Requesting: hydrocodone Contract: none UDS: none Last Visit: 06/25/20 Next Visit: 12/25/20 Last Refill: 04/17/20  Please Advise

## 2020-08-13 ENCOUNTER — Other Ambulatory Visit: Payer: Self-pay | Admitting: Family Medicine

## 2020-08-13 NOTE — Telephone Encounter (Signed)
LM for pt to return call to discuss need for refill

## 2020-08-13 NOTE — Telephone Encounter (Signed)
Requesting:   HYDROcodone-acetaminophen (NORCO/VICODIN) 5-325 MG tablet    Contract: n/a UDS:n/a Last Visit:06/25/20 Next Visit:12/25/20 Last Refill: 07/21/20  Please Advise

## 2020-09-08 ENCOUNTER — Telehealth: Payer: Self-pay

## 2020-09-08 NOTE — Telephone Encounter (Signed)
Pt should not need a new referral, especially if he has already secured an appt with them.

## 2020-09-08 NOTE — Telephone Encounter (Signed)
Sent as FYI. 

## 2020-09-08 NOTE — Telephone Encounter (Signed)
Patient wife (DPR) called back --- she verified with insurance that Jorja Loa does not need a referral to [facility].  Patient has been seen there before, therefore referral for an established patient is not needed.  No follow up call needed at this time. I was not able to close encounter because it has already been routed to [facility]. Please close.

## 2020-09-08 NOTE — Telephone Encounter (Signed)
Pt states that he has an appointment at [facility] tomorrow at 1. He has a kidney stone and wanted to know if he needed a referral placed due to insurance.   Please advise if okay to place referral. Pt last seen 06/25/20

## 2020-09-10 ENCOUNTER — Other Ambulatory Visit: Payer: Self-pay | Admitting: Urology

## 2020-09-10 DIAGNOSIS — N201 Calculus of ureter: Secondary | ICD-10-CM

## 2020-09-11 NOTE — Progress Notes (Signed)
Patient to arrive at 0915 on 09/15/2020. History and medications reviewed. Pre-procedure instructions given. NPO after MN on Sunday except for clear liquids until 0715. BP medications day of procedure with sip of water. Patient reports consuming a drink daily with apple cider vinegar and turmeric. He did have this today. Will report to [place] [facility] today for OK to proceed. Driver secured.

## 2020-09-11 NOTE — Progress Notes (Signed)
[facility] stated OK to proceed on 09/15/2020 with regards to tumeric drink. Patient was instructed to stop until after procedure during pre-op phone call.Marland Kitchen

## 2020-09-12 ENCOUNTER — Other Ambulatory Visit (HOSPITAL_COMMUNITY)
Admission: RE | Admit: 2020-09-12 | Discharge: 2020-09-12 | Disposition: A | Discharge status: Home / Self Care | Payer: No Typology Code available for payment source | Source: Ambulatory Visit | Attending: Urology | Admitting: Urology

## 2020-09-12 DIAGNOSIS — Z01812 Encounter for preprocedural laboratory examination: Secondary | ICD-10-CM | POA: Diagnosis present

## 2020-09-12 DIAGNOSIS — Z20822 Contact with and (suspected) exposure to covid-19: Secondary | ICD-10-CM | POA: Insufficient documentation

## 2020-09-13 LAB — SARS CORONAVIRUS 2 (TAT 6-24 HRS): SARS Coronavirus 2: NEGATIVE

## 2020-09-15 ENCOUNTER — Ambulatory Visit (HOSPITAL_COMMUNITY): Payer: No Typology Code available for payment source

## 2020-09-15 ENCOUNTER — Encounter (HOSPITAL_BASED_OUTPATIENT_CLINIC_OR_DEPARTMENT_OTHER)
Admission: RE | Disposition: A | Discharge status: Home / Self Care | Payer: Self-pay | Source: Home / Self Care | Attending: Urology

## 2020-09-15 ENCOUNTER — Other Ambulatory Visit: Payer: Self-pay

## 2020-09-15 ENCOUNTER — Encounter (HOSPITAL_BASED_OUTPATIENT_CLINIC_OR_DEPARTMENT_OTHER): Payer: Self-pay | Admitting: Urology

## 2020-09-15 ENCOUNTER — Ambulatory Visit (HOSPITAL_BASED_OUTPATIENT_CLINIC_OR_DEPARTMENT_OTHER)
Admission: RE | Admit: 2020-09-15 | Discharge: 2020-09-15 | Disposition: A | Discharge status: Home / Self Care | Payer: No Typology Code available for payment source | Attending: Urology | Admitting: Urology

## 2020-09-15 DIAGNOSIS — Z87442 Personal history of urinary calculi: Secondary | ICD-10-CM | POA: Diagnosis not present

## 2020-09-15 DIAGNOSIS — I10 Essential (primary) hypertension: Secondary | ICD-10-CM | POA: Diagnosis not present

## 2020-09-15 DIAGNOSIS — N202 Calculus of kidney with calculus of ureter: Secondary | ICD-10-CM | POA: Diagnosis not present

## 2020-09-15 DIAGNOSIS — N201 Calculus of ureter: Secondary | ICD-10-CM | POA: Diagnosis present

## 2020-09-15 HISTORY — PX: EXTRACORPOREAL SHOCK WAVE LITHOTRIPSY: SHX1557

## 2020-09-15 SURGERY — LITHOTRIPSY, ESWL
Anesthesia: LOCAL | Laterality: Left

## 2020-09-15 MED ORDER — DIPHENHYDRAMINE HCL 25 MG PO CAPS
25.0000 mg | ORAL_CAPSULE | ORAL | Status: AC
  Administered 2020-09-15: 25 mg via ORAL

## 2020-09-15 MED ORDER — SODIUM CHLORIDE 0.9 % IV SOLN: INTRAVENOUS | Status: DC

## 2020-09-15 MED ORDER — DIAZEPAM 5 MG PO TABS
10.0000 mg | ORAL_TABLET | ORAL | Status: AC
  Administered 2020-09-15: 10 mg via ORAL

## 2020-09-15 MED ORDER — CIPROFLOXACIN HCL 500 MG PO TABS
ORAL_TABLET | ORAL | Status: AC
  Filled 2020-09-15: qty 1

## 2020-09-15 MED ORDER — DIAZEPAM 5 MG PO TABS
ORAL_TABLET | ORAL | Status: AC
  Filled 2020-09-15: qty 2

## 2020-09-15 MED ORDER — DIPHENHYDRAMINE HCL 25 MG PO CAPS
ORAL_CAPSULE | ORAL | Status: AC
  Filled 2020-09-15: qty 1

## 2020-09-15 MED ORDER — CIPROFLOXACIN HCL 500 MG PO TABS
500.0000 mg | ORAL_TABLET | ORAL | Status: AC
  Administered 2020-09-15: 500 mg via ORAL

## 2020-09-15 NOTE — H&P (Signed)
See scanned H&P

## 2020-09-15 NOTE — Op Note (Signed)
See [facility] OP note scanned into chart. Also because of the size, density, location and other factors that cannot be anticipated I feel this will likely be a staged procedure. This fact supersedes any indication in the scanned [facility] operative note to the contrary.  

## 2020-09-16 ENCOUNTER — Encounter: Payer: Self-pay | Admitting: Family Medicine

## 2020-09-17 ENCOUNTER — Encounter (HOSPITAL_BASED_OUTPATIENT_CLINIC_OR_DEPARTMENT_OTHER): Payer: Self-pay | Admitting: Urology

## 2020-11-04 ENCOUNTER — Other Ambulatory Visit: Payer: Self-pay | Admitting: Family Medicine

## 2020-11-10 ENCOUNTER — Other Ambulatory Visit: Payer: Self-pay | Admitting: Family Medicine

## 2020-11-10 MED ORDER — IBUPROFEN 800 MG PO TABS
800.0000 mg | ORAL_TABLET | Freq: Three times a day (TID) | ORAL | 0 refills | Status: DC | PRN
Start: 2020-11-10 — End: 2020-12-25

## 2020-12-18 ENCOUNTER — Telehealth: Payer: Self-pay | Admitting: Family Medicine

## 2020-12-18 NOTE — Telephone Encounter (Signed)
Pt called to confirm his appt for next week and I let him know he had to scheduled and he said he doesn't know anything about the prolia shot and doesn't think he is supposed to get it but said if he does need to get it then he can get it at his physical appt. Can someone look into this and see if he is supposed to be getting a prolia shot and if not let the pt know. I will cancel the nurse appt for now per pts request

## 2020-12-19 NOTE — Telephone Encounter (Signed)
Spoke with pt to advise currently not a need for prolia shot, nothing found in past o/v notes to support this. Please disregard for now. Pt voiced understanding.

## 2020-12-23 ENCOUNTER — Ambulatory Visit: Payer: PRIVATE HEALTH INSURANCE

## 2020-12-25 ENCOUNTER — Ambulatory Visit (INDEPENDENT_AMBULATORY_CARE_PROVIDER_SITE_OTHER): Payer: No Typology Code available for payment source | Admitting: Family Medicine

## 2020-12-25 ENCOUNTER — Encounter: Payer: Self-pay | Admitting: Family Medicine

## 2020-12-25 ENCOUNTER — Other Ambulatory Visit: Payer: Self-pay

## 2020-12-25 VITALS — BP 137/76 | HR 50 | Temp 97.6°F | Ht 70.08 in | Wt 220.4 lb

## 2020-12-25 DIAGNOSIS — Z0000 Encounter for general adult medical examination without abnormal findings: Secondary | ICD-10-CM

## 2020-12-25 DIAGNOSIS — Z23 Encounter for immunization: Secondary | ICD-10-CM | POA: Diagnosis not present

## 2020-12-25 DIAGNOSIS — I10 Essential (primary) hypertension: Secondary | ICD-10-CM | POA: Diagnosis not present

## 2020-12-25 DIAGNOSIS — N200 Calculus of kidney: Secondary | ICD-10-CM

## 2020-12-25 DIAGNOSIS — G894 Chronic pain syndrome: Secondary | ICD-10-CM

## 2020-12-25 DIAGNOSIS — Z125 Encounter for screening for malignant neoplasm of prostate: Secondary | ICD-10-CM | POA: Diagnosis not present

## 2020-12-25 DIAGNOSIS — Z1211 Encounter for screening for malignant neoplasm of colon: Secondary | ICD-10-CM | POA: Diagnosis not present

## 2020-12-25 LAB — COMPREHENSIVE METABOLIC PANEL
ALT: 22 U/L (ref 0–53)
AST: 20 U/L (ref 0–37)
Albumin: 4.2 g/dL (ref 3.5–5.2)
Alkaline Phosphatase: 70 U/L (ref 39–117)
BUN: 13 mg/dL (ref 6–23)
CO2: 28 mEq/L (ref 19–32)
Calcium: 9.4 mg/dL (ref 8.4–10.5)
Chloride: 105 mEq/L (ref 96–112)
Creatinine, Ser: 1.13 mg/dL (ref 0.40–1.50)
GFR: 69.05 mL/min (ref >60.00)
Glucose, Bld: 90 mg/dL (ref 70–99)
Potassium: 4.4 mEq/L (ref 3.5–5.1)
Sodium: 142 mEq/L (ref 135–145)
Total Bilirubin: 0.8 mg/dL (ref 0.2–1.2)
Total Protein: 7 g/dL (ref 6.0–8.3)

## 2020-12-25 LAB — CBC WITH DIFFERENTIAL/PLATELET
Basophils Absolute: 0.1 10*3/uL (ref 0.0–0.1)
Basophils Relative: 1.2 % (ref 0.0–3.0)
Eosinophils Absolute: 0.1 10*3/uL (ref 0.0–0.7)
Eosinophils Relative: 2.1 % (ref 0.0–5.0)
HCT: 44.5 % (ref 39.0–52.0)
Hemoglobin: 14.8 g/dL (ref 13.0–17.0)
Lymphocytes Relative: 26 % (ref 12.0–46.0)
Lymphs Abs: 1.5 10*3/uL (ref 0.7–4.0)
MCHC: 33.3 g/dL (ref 30.0–36.0)
MCV: 92.9 fl (ref 78.0–100.0)
Monocytes Absolute: 0.6 10*3/uL (ref 0.1–1.0)
Monocytes Relative: 10.1 % (ref 3.0–12.0)
Neutro Abs: 3.6 10*3/uL (ref 1.4–7.7)
Neutrophils Relative %: 60.6 % (ref 43.0–77.0)
Platelets: 163 10*3/uL (ref 150.0–400.0)
RBC: 4.78 Mil/uL (ref 4.22–5.81)
RDW: 13.2 % (ref 11.5–15.5)
WBC: 5.9 10*3/uL (ref 4.0–10.5)

## 2020-12-25 LAB — LIPID PANEL
Cholesterol: 192 mg/dL (ref 0–200)
HDL: 52 mg/dL (ref >39.00)
LDL Cholesterol: 115 mg/dL — ABNORMAL HIGH (ref 0–99)
NonHDL: 139.6
Total CHOL/HDL Ratio: 4
Triglycerides: 122 mg/dL (ref 0.0–149.0)
VLDL: 24.4 mg/dL (ref 0.0–40.0)

## 2020-12-25 LAB — PSA: PSA: 1.38 ng/mL (ref 0.10–4.00)

## 2020-12-25 MED ORDER — HYDROCODONE-ACETAMINOPHEN 5-325 MG PO TABS: ORAL_TABLET | ORAL | 0 refills | Status: DC

## 2020-12-25 MED ORDER — IBUPROFEN 800 MG PO TABS: 800.0000 mg | ORAL_TABLET | Freq: Three times a day (TID) | ORAL | 3 refills | Status: DC | PRN

## 2020-12-25 NOTE — Progress Notes (Signed)
Office Note 12/25/2020  CC:  Chief Complaint  Patient presents with   Annual Exam    Pt is fasting    HPI:  Christopher Hunter is a 65 y.o. White male with HTN and hx of recurrent nephrolithiasis who is here for annual health maintenance exam.   BP: home bp monitoring consistently <130/80. Takes one 800 mg ibup most mornings.  Stones;  he had another episode of L ureterolithiasis and required lithotripsy 09/15/20. For his recurrent stones I do provide him with vicodin to have on hand and start treating at beginning of episodes and he has always demonstrated appropriate use of this med. PMP AWARE reviewed today: most recent rx for vicodin was filled 6/722, # 20, rx by urol.  Most recent from me filled 08/18/20, #10. No red flags.  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 Lonny Prude    History reviewed. No pertinent family history.  Social History   Socioeconomic History   Marital status: Married    Spouse name:  Not on file   Number of children: Not on file   Years of education: Not on file   Highest education level: Not on file  Occupational History   Not on file  Tobacco Use   Smoking status: Never   Smokeless tobacco: Current    Types: Snuff  Vaping Use   Vaping Use: Never used  Substance and Sexual Activity   Alcohol use: No   Drug use: No   Sexual activity: Not on file  Other Topics Concern   Not on file  Social History Narrative   Married, one 60 yr old son.   Lives in [place], [place].  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   Never smoker, no ETOH, no drugs.      Social Determinants of Health   Financial Resource Strain: Not on file  Food Insecurity: Not on file  Transportation Needs: Not on file  Physical Activity: Not on file  Stress: Not on file  Social Connections: Not on file  Intimate Partner Violence: Not on file    Outpatient Medications Prior to Visit  Medication Sig Dispense Refill   amLODipine (NORVASC) 10 MG tablet TAKE ONE (1) TABLET BY MOUTH EVERY DAY 90 tablet 3   cetirizine (ZYRTEC) 10 MG tablet Take 10 mg by mouth daily.     irbesartan (AVAPRO)  300 MG tablet TAKE ONE (1) TABLET BY MOUTH EVERY DAY 90 tablet 3   metoprolol succinate (TOPROL-XL) 50 MG 24 hr tablet TAKE 1 TABLET BY MOUTH DAILY WITH OR IMMEDIATELY FOLLOWING A MEAL 90 tablet 3   Multiple Vitamin (MULTIVITAMIN) tablet Take 1 tablet by mouth daily.     HYDROcodone-acetaminophen (NORCO/VICODIN) 5-325 MG tablet TAKE ONE TO TWO TABLETS BY MOUTH EVERY SIX HOURS AS NEEDED 10 tablet 0   ibuprofen (ADVIL) 800 MG tablet Take 1 tablet (800 mg total) by mouth every 8 (eight) hours as needed. 30 tablet 0   No facility-administered medications prior to visit.    No Known Allergies  ROS Review of Systems  Constitutional:  Negative for appetite change, chills, fatigue and fever.  HENT:  Negative for congestion, dental problem, ear pain and sore throat.   Eyes:  Negative for discharge,  redness and visual disturbance.  Respiratory:  Negative for cough, chest tightness, shortness of breath and wheezing.   Cardiovascular:  Negative for chest pain, palpitations and leg swelling.  Gastrointestinal:  Negative for abdominal pain, blood in stool, diarrhea, nausea and vomiting.  Genitourinary:  Negative for difficulty urinating, dysuria, flank pain, frequency, hematuria and urgency.  Musculoskeletal:  Negative for arthralgias, back pain, joint swelling, myalgias and neck stiffness.  Skin:  Negative for pallor and rash.  Neurological:  Negative for dizziness, speech difficulty, weakness and headaches.  Hematological:  Negative for adenopathy. Does not bruise/bleed easily.  Psychiatric/Behavioral:  Negative for confusion and sleep disturbance. The patient is not nervous/anxious.    PE; Vitals with BMI 12/25/2020 09/15/2020 09/15/2020  Height 5' 10.079" - 5\' 10"   Weight 220 lbs 6 oz - 213 lbs  BMI 31.55 - 30.56  Systolic 137 137  Diastolic 76 75 80  Pulse 50 60 60    Gen: Alert, well appearing.  Patient is oriented to person, place, time, and situation. AFFECT: pleasant, lucid thought and speech. ENT: Ears: EACs clear, normal epithelium.  TMs with good light reflex and landmarks bilaterally.  Eyes: no injection, icteris, swelling, or exudate.  EOMI, PERRLA. Nose: no drainage or turbinate edema/swelling.  No injection or focal lesion.  Mouth: lips without lesion/swelling.  Oral mucosa pink and moist.  Dentition intact and without obvious caries or gingival swelling.  Oropharynx without erythema, exudate, or swelling.  Neck: supple/nontender.  No LAD, mass, or TM.  Carotid pulses 2+ bilaterally, without bruits. CV: RRR, no m/r/g.   LUNGS: CTA bilat, nonlabored resps, good aeration in all lung fields. ABD: soft, NT, ND, BS normal.  No hepatospenomegaly or mass.  No bruits. EXT: no clubbing, cyanosis, or edema.  Musculoskeletal: no joint swelling, erythema, warmth, or tenderness.   ROM of all joints intact. Skin - no sores or suspicious lesions or rashes or color changes  Pertinent labs:  Lab Results  Component Value Date   TSH 1.03 01/01/2020   Lab Results  Component Value Date   WBC 6.9 01/01/2020   HGB 15.0 01/01/2020   HCT 44.6 01/01/2020   MCV 92.7 01/01/2020   PLT 147.0 (L) 01/01/2020   Lab Results  Component Value Date   CREATININE 0.97 06/25/2020   BUN 15 06/25/2020   NA 143 06/25/2020   K 3.9 06/25/2020   CL 107 06/25/2020   CO2 29 06/25/2020   Lab Results  Component Value Date   ALT 20 01/01/2020   AST 18 01/01/2020   ALKPHOS 66 01/01/2020   BILITOT 0.7 01/01/2020   Lab Results  Component Value Date   CHOL 182 01/01/2020   Lab Results  Component Value Date   HDL 52.30 01/01/2020   Lab Results  Component Value Date   LDLCALC 117 (H) 01/01/2020   Lab Results  Component Value Date   TRIG 60.0 01/01/2020   Lab Results  Component Value Date   CHOLHDL 3 01/01/2020   Lab Results  Component Value Date   PSA 1.35 01/01/2020   PSA 1.44 12/22/2018   PSA 1.28 12/08/2017   Lab Results  Component Value Date   HGBA1C 5.7 06/25/2020   ASSESSMENT AND PLAN:   1) HTN: well controlled on amlod 10 qd, irbesartan 300 qd, and toprolx xl 50 qd. Lytes/cr today.  2) Recurrent ureterolithiasis: I do rx 10 vicodin periodically for him to have on hand and use prn stone passage.  Vicodin 5/325, #10 rx'd today.  3) Health maintenance exam: Reviewed age and gender appropriate health maintenance issues (prudent diet, regular exercise, health risks of tobacco and excessive alcohol, use of seatbelts, fire alarms in home, use of sunscreen).  Also reviewed age and gender appropriate health screening as well as vaccine recommendations. Vaccines:  Flu->pt declined.  Shingrix->#1 today.  Otherwise ALL UTD. Labs: cbc, cmet, flp, psa. Prostate ca screening: PSA today. Colon ca screening: due for colon ca screening->As per CPE last year, he was going to  arrange this through a GI MD in [place] where the rest of his family has gone in the past-->he never did this and he decided today to get iFOB.  An After Visit Summary was printed and given to the patient.  FOLLOW UP:  Return in about 6 months (around 06/27/2021) for routine chronic illness f/u.  Signed:  Santiago Bumpers, MD           12/25/2020

## 2021-06-09 ENCOUNTER — Other Ambulatory Visit: Payer: Self-pay

## 2021-06-10 ENCOUNTER — Ambulatory Visit (INDEPENDENT_AMBULATORY_CARE_PROVIDER_SITE_OTHER): Payer: No Typology Code available for payment source | Admitting: Family Medicine

## 2021-06-10 ENCOUNTER — Encounter: Payer: Self-pay | Admitting: Family Medicine

## 2021-06-10 VITALS — BP 135/66 | HR 61 | Temp 97.9°F | Ht 70.0 in | Wt 222.0 lb

## 2021-06-10 DIAGNOSIS — Z23 Encounter for immunization: Secondary | ICD-10-CM

## 2021-06-10 DIAGNOSIS — Z1211 Encounter for screening for malignant neoplasm of colon: Secondary | ICD-10-CM

## 2021-06-10 DIAGNOSIS — I10 Essential (primary) hypertension: Secondary | ICD-10-CM | POA: Diagnosis not present

## 2021-06-10 DIAGNOSIS — N200 Calculus of kidney: Secondary | ICD-10-CM | POA: Diagnosis not present

## 2021-06-10 LAB — FECAL OCCULT BLOOD, IMMUNOCHEMICAL: Fecal Occult Bld: NEGATIVE

## 2021-06-10 MED ORDER — IRBESARTAN 300 MG PO TABS: ORAL_TABLET | ORAL | 3 refills | Status: DC

## 2021-06-10 MED ORDER — METOPROLOL SUCCINATE ER 50 MG PO TB24: ORAL_TABLET | ORAL | 3 refills | Status: DC

## 2021-06-10 MED ORDER — AMLODIPINE BESYLATE 10 MG PO TABS: ORAL_TABLET | ORAL | 3 refills | Status: DC

## 2021-06-10 MED ORDER — HYDROCODONE-ACETAMINOPHEN 5-325 MG PO TABS: ORAL_TABLET | ORAL | 0 refills | Status: DC

## 2021-06-10 MED ORDER — IBUPROFEN 800 MG PO TABS: 800.0000 mg | ORAL_TABLET | Freq: Three times a day (TID) | ORAL | 1 refills | Status: DC | PRN

## 2021-06-10 NOTE — Progress Notes (Signed)
OFFICE VISIT  06/10/2021  CC: f/u HTN  HPI:    Patient is a 66 y.o. male who presents for 6 mo f/u HTN and recurrent nephrolithiasis. A/P as of last visit: "1) HTN: well controlled on amlod 10 qd, irbesartan 300 qd, and toprolx xl 50 qd. Lytes/cr today.   2) Recurrent ureterolithiasis: I do rx 10 vicodin periodically for him to have on hand and use prn stone passage.  Vicodin 5/325, #10 rx'd today.   3) Health maintenance exam: Reviewed age and gender appropriate health maintenance issues (prudent diet, regular exercise, health risks of tobacco and excessive alcohol, use of seatbelts, fire alarms in home, use of sunscreen).  Also reviewed age and gender appropriate health screening as well as vaccine recommendations. Vaccines:  Flu->pt declined.  Shingrix->#1 today.  Otherwise ALL UTD. Labs: cbc, cmet, flp, psa. Prostate ca screening: PSA today. Colon ca screening: due for colon ca screening->As per CPE last year, he was going to arrange this through a GI MD in [place], [place] where the rest of his family has gone in the past-->he never did this and he decided today to get iFOB."  INTERIM HX: Christopher Hunter is feeling fine currently. He has had some stones in the last half year or so, required lithotripsy and ureteral stent placement.  He went through some Vicodin and ibuprofen in treatment of these and used these very appropriately. Blood pressures sometimes elevated when he is in significant pain but otherwise normal.   For his recurrent stones I do provide him with vicodin to have on hand and start treating at beginning of episodes and he has always demonstrated appropriate use of this med. PMP AWARE reviewed today: most recent rx for vicodin was filled 04/16/21, # 20, rx by Jiles Crocker in urology. Most recent rx from me that pt filled was on 12/25/20, #10. No red flags.  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 Overton Mam    Outpatient Medications Prior to Visit  Medication Sig Dispense Refill   cetirizine (ZYRTEC) 10 MG tablet Take 10 mg by mouth daily.     Multiple Vitamin (MULTIVITAMIN) tablet Take 1 tablet by mouth daily.     amLODipine (NORVASC) 10 MG tablet TAKE ONE (1) TABLET BY MOUTH EVERY DAY 90 tablet 3   HYDROcodone-acetaminophen (NORCO/VICODIN) 5-325 MG tablet TAKE ONE TO TWO TABLETS BY MOUTH  EVERY SIX HOURS AS NEEDED 10 tablet 0   ibuprofen (ADVIL) 800 MG tablet Take 1 tablet (800 mg total) by mouth every 8 (eight) hours as needed. 90 tablet 3   irbesartan (AVAPRO) 300 MG tablet TAKE ONE (1) TABLET BY MOUTH EVERY DAY 90 tablet 3   metoprolol succinate (TOPROL-XL) 50 MG 24 hr tablet TAKE 1 TABLET BY MOUTH DAILY WITH OR IMMEDIATELY FOLLOWING A MEAL 90 tablet 3   No facility-administered medications prior to visit.    No Known Allergies  ROS As per HPI  PE: Vitals with BMI 06/10/2021 12/25/2020 09/15/2020  Height 5\' 10"  5' 10.079" -  Weight 222 lbs 220 lbs 6 oz -  BMI [id number] -  Systolic [id number]  0000000 0000000  Diastolic 66 76 75  Pulse 61 50 60     Physical Exam  Gen: Alert, well appearing.  Patient is oriented to person, place, time, and situation. AFFECT: pleasant, lucid thought and speech. CV: RRR, no m/r/g.   LUNGS: CTA bilat, nonlabored resps, good aeration in all lung fields. EXT: no clubbing or cyanosis.  no edema.    LABS:  Last CBC Lab Results  Component Value Date   WBC 5.9 12/25/2020   HGB 14.8 12/25/2020   HCT 44.5 12/25/2020   MCV 92.9 12/25/2020   MCH 31.0 10/20/2018   RDW 13.2 12/25/2020   PLT 163.0 [id number]   Last metabolic panel Lab Results  Component Value Date   GLUCOSE 90 12/25/2020   NA 142 12/25/2020   K 4.4 12/25/2020   CL 105 12/25/2020   CO2 28 12/25/2020   BUN 13 12/25/2020   CREATININE 1.13 12/25/2020   GFRNONAA >60 10/20/2018   CALCIUM 9.4 12/25/2020   PROT 7.0 12/25/2020   ALBUMIN 4.2 12/25/2020   BILITOT 0.8 12/25/2020   ALKPHOS 70 12/25/2020   AST 20 12/25/2020   ALT 22 12/25/2020   ANIONGAP 8 10/20/2018   Last lipids Lab Results  Component Value Date   CHOL 192 12/25/2020   HDL 52.00 12/25/2020   LDLCALC 115 (H) 12/25/2020   TRIG 122.0 12/25/2020   CHOLHDL 4 12/25/2020   Last hemoglobin A1c Lab Results  Component Value Date   HGBA1C 5.7 06/25/2020   Last thyroid functions Lab Results  Component Value Date   TSH 1.03 01/01/2020   IMPRESSION AND PLAN:  #1 hypertension, well controlled on Toprol-XL 50 mg a day, amlodipine 10 mg a day, and irbesartan 300 mg/day. Electrolytes and creatinine checked today.   #2 Recurrent nephro/ureterolithiasis: I do rx 10 vicodin periodically for him to have on hand and use prn stone passage.  Vicodin 5/325, #10 rx'd today. He is followed by Dr. Gloriann Loan in urology.  #3 definitive health care. He turned in his iFOB today. Shingrix No. 2 today.  An After Visit Summary was printed and given to the patient.  FOLLOW UP: Return in about 6 months (around 12/08/2021) for annual CPE  (fasting).  Signed:  Crissie Sickles, MD           06/10/2021

## 2021-06-11 LAB — BASIC METABOLIC PANEL
BUN: 17 mg/dL (ref 6–23)
CO2: 30 mEq/L (ref 19–32)
Calcium: 9.4 mg/dL (ref 8.4–10.5)
Chloride: 107 mEq/L (ref 96–112)
Creatinine, Ser: 1.12 mg/dL (ref 0.40–1.50)
GFR: 69.56 mL/min (ref >60.00)
Glucose, Bld: 113 mg/dL — ABNORMAL HIGH (ref 70–99)
Potassium: 3.9 mEq/L (ref 3.5–5.1)
Sodium: 145 mEq/L (ref 135–145)

## 2021-06-16 ENCOUNTER — Encounter: Payer: Self-pay | Admitting: Family Medicine

## 2021-09-15 ENCOUNTER — Other Ambulatory Visit: Payer: Self-pay | Admitting: Family Medicine

## 2021-12-09 ENCOUNTER — Ambulatory Visit (INDEPENDENT_AMBULATORY_CARE_PROVIDER_SITE_OTHER): Payer: No Typology Code available for payment source | Admitting: Family Medicine

## 2021-12-09 ENCOUNTER — Encounter: Payer: Self-pay | Admitting: Family Medicine

## 2021-12-09 VITALS — BP 130/74 | HR 54 | Temp 97.7°F | Ht 70.5 in | Wt 224.0 lb

## 2021-12-09 DIAGNOSIS — I10 Essential (primary) hypertension: Secondary | ICD-10-CM

## 2021-12-09 DIAGNOSIS — Z0000 Encounter for general adult medical examination without abnormal findings: Secondary | ICD-10-CM

## 2021-12-09 DIAGNOSIS — R7301 Impaired fasting glucose: Secondary | ICD-10-CM | POA: Diagnosis not present

## 2021-12-09 DIAGNOSIS — Z125 Encounter for screening for malignant neoplasm of prostate: Secondary | ICD-10-CM | POA: Diagnosis not present

## 2021-12-09 DIAGNOSIS — Z23 Encounter for immunization: Secondary | ICD-10-CM

## 2021-12-09 LAB — PSA: PSA: 1.56 ng/mL (ref 0.10–4.00)

## 2021-12-09 LAB — CBC WITH DIFFERENTIAL/PLATELET
Basophils Absolute: 0.1 10*3/uL (ref 0.0–0.1)
Basophils Relative: 1.3 % (ref 0.0–3.0)
Eosinophils Absolute: 0.2 10*3/uL (ref 0.0–0.7)
Eosinophils Relative: 4.2 % (ref 0.0–5.0)
HCT: 46.3 % (ref 39.0–52.0)
Hemoglobin: 15.7 g/dL (ref 13.0–17.0)
Lymphocytes Relative: 28 % (ref 12.0–46.0)
Lymphs Abs: 1.4 10*3/uL (ref 0.7–4.0)
MCHC: 33.9 g/dL (ref 30.0–36.0)
MCV: 91 fl (ref 78.0–100.0)
Monocytes Absolute: 0.5 10*3/uL (ref 0.1–1.0)
Monocytes Relative: 10.8 % (ref 3.0–12.0)
Neutro Abs: 2.7 10*3/uL (ref 1.4–7.7)
Neutrophils Relative %: 55.7 % (ref 43.0–77.0)
Platelets: 156 10*3/uL (ref 150.0–400.0)
RBC: 5.08 Mil/uL (ref 4.22–5.81)
RDW: 13.1 % (ref 11.5–15.5)
WBC: 4.9 10*3/uL (ref 4.0–10.5)

## 2021-12-09 LAB — COMPREHENSIVE METABOLIC PANEL
ALT: 15 U/L (ref 0–53)
AST: 14 U/L (ref 0–37)
Albumin: 4.3 g/dL (ref 3.5–5.2)
Alkaline Phosphatase: 73 U/L (ref 39–117)
BUN: 12 mg/dL (ref 6–23)
CO2: 30 mEq/L (ref 19–32)
Calcium: 9.4 mg/dL (ref 8.4–10.5)
Chloride: 106 mEq/L (ref 96–112)
Creatinine, Ser: 1.01 mg/dL (ref 0.40–1.50)
GFR: 78.48 mL/min (ref >60.00)
Glucose, Bld: 95 mg/dL (ref 70–99)
Potassium: 3.7 mEq/L (ref 3.5–5.1)
Sodium: 144 mEq/L (ref 135–145)
Total Bilirubin: 0.9 mg/dL (ref 0.2–1.2)
Total Protein: 6.9 g/dL (ref 6.0–8.3)

## 2021-12-09 LAB — LIPID PANEL
Cholesterol: 183 mg/dL (ref 0–200)
HDL: 46.2 mg/dL (ref >39.00)
LDL Cholesterol: 115 mg/dL — ABNORMAL HIGH (ref 0–99)
NonHDL: 137.1
Total CHOL/HDL Ratio: 4
Triglycerides: 113 mg/dL (ref 0.0–149.0)
VLDL: 22.6 mg/dL (ref 0.0–40.0)

## 2021-12-09 LAB — HEMOGLOBIN A1C: Hgb A1c MFr Bld: 6 % (ref 4.6–6.5)

## 2021-12-09 MED ORDER — HYDROCODONE-ACETAMINOPHEN 5-325 MG PO TABS: ORAL_TABLET | ORAL | 0 refills | Status: DC

## 2021-12-09 MED ORDER — IBUPROFEN 800 MG PO TABS: ORAL_TABLET | ORAL | 1 refills | Status: DC

## 2021-12-09 NOTE — Progress Notes (Signed)
Office Note 12/09/2021  CC:  Chief Complaint  Patient presents with   Annual Exam    Pt is fasting    HPI:  Patient is a 66 y.o. male who is here for annual health maintenance exam and follow-up hypertension. Home blood pressure check occasionally is normal.  He has been having kidney stone symptoms of late: Some right CVA/flank pain and some episodes of gross hematuria.  He has a history of stones requiring lithotripsy as well as ureteroscopy with extraction.  He has seen his urologist, Dr. Gloriann Loan, for his current symptoms and they plan to do ureteroscopy with stone extraction soon.  He says his pain currently is pretty mild and there is no radiation around the side or into the groin. He has 3 hydrocodone tabs left.  His procedure is next week.  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 Overton Mam    History reviewed. No pertinent family history.  Social History    Socioeconomic History   Marital status: Married    Spouse name: Not on file   Number of children: Not on file   Years of education: Not on file   Highest education level: Not on file  Occupational History   Not on file  Tobacco Use   Smoking status: Never   Smokeless tobacco: Current    Types: Snuff  Vaping Use   Vaping Use: Never used  Substance and Sexual Activity   Alcohol use: No   Drug use: No   Sexual activity: Not on file  Other Topics Concern   Not on file  Social History Narrative   Married, one 26 yr old son.   Lives in [place], [place].  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   Never smoker, no ETOH, no drugs.      Social Determinants of Health   Financial Resource Strain: Not on file  Food Insecurity: Not on file  Transportation Needs: Not on file  Physical Activity: Not on file  Stress: Not on file  Social Connections: Not on file  Intimate Partner Violence: Not on file    Outpatient Medications Prior to Visit  Medication Sig Dispense Refill   amLODipine (NORVASC) 10 MG tablet TAKE ONE (1) TABLET BY MOUTH EVERY DAY 90 tablet 3   cetirizine (ZYRTEC) 10 MG  tablet Take 10 mg by mouth daily.     irbesartan (AVAPRO) 300 MG tablet TAKE ONE (1) TABLET BY MOUTH EVERY DAY 90 tablet 3   metoprolol succinate (TOPROL-XL) 50 MG 24 hr tablet TAKE 1 TABLET BY MOUTH DAILY WITH OR IMMEDIATELY FOLLOWING A MEAL 90 tablet 3   Multiple Vitamin (MULTIVITAMIN) tablet Take 1 tablet by mouth daily.     tamsulosin (FLOMAX) 0.4 MG CAPS capsule Take 0.4 mg by mouth daily.     HYDROcodone-acetaminophen (NORCO/VICODIN) 5-325 MG tablet TAKE ONE TO TWO TABLETS BY MOUTH EVERY SIX HOURS AS NEEDED 10 tablet 0   ibuprofen (ADVIL) 800 MG tablet TAKE ONE TABLET ($RemoveBef'800MG'mNoTyXYFwx$  TOTAL) BY MOUTH EVERY 8 HOURS AS NEEDED 60 tablet 1   No facility-administered medications prior to visit.    No Known Allergies  ROS Review of Systems  Constitutional:  Negative for appetite change, chills,  fatigue and fever.  HENT:  Negative for congestion, dental problem, ear pain and sore throat.   Eyes:  Negative for discharge, redness and visual disturbance.  Respiratory:  Negative for cough, chest tightness, shortness of breath and wheezing.   Cardiovascular:  Negative for chest pain, palpitations and leg swelling.  Gastrointestinal:  Negative for abdominal pain, blood in stool, diarrhea, nausea and vomiting.  Genitourinary:  Positive for flank pain and hematuria. Negative for difficulty urinating, dysuria, frequency and urgency.  Musculoskeletal:  Negative for arthralgias, back pain, joint swelling, myalgias and neck stiffness.  Skin:  Negative for pallor and rash.  Neurological:  Negative for dizziness, speech difficulty, weakness and headaches.  Hematological:  Negative for adenopathy. Does not bruise/bleed easily.  Psychiatric/Behavioral:  Negative for confusion and sleep disturbance. The patient is not nervous/anxious.     PE;    12/09/2021    8:02 AM 06/10/2021    7:59 AM 12/25/2020    8:04 AM  Vitals with BMI  Height 5' 10.5" $Remove'5\' 10"'oVfFxwo$  5' 10.079"  Weight 224 lbs 222 lbs 220 lbs 6 oz  BMI 31.68 45.62 56.38  Systolic 937 342 876  Diastolic 74 66 76  Pulse 54 61 50    Gen: Alert, well appearing.  Patient is oriented to person, place, time, and situation. AFFECT: pleasant, lucid thought and speech. ENT: Ears: EACs clear, normal epithelium.  TMs with good light reflex and landmarks bilaterally.  Eyes: no injection, icteris, swelling, or exudate.  EOMI, PERRLA. Nose: no drainage or turbinate edema/swelling.  No injection or focal lesion.  Mouth: lips without lesion/swelling.  Oral mucosa pink and moist.  Dentition intact and without obvious caries or gingival swelling.  Oropharynx without erythema, exudate, or swelling.  Neck: supple/nontender.  No LAD, mass, or TM.  Carotid pulses 2+ bilaterally, without bruits. CV: RRR, no m/r/g.   LUNGS: CTA bilat, nonlabored resps, good aeration in  all lung fields. ABD: soft, NT, ND, BS normal.  No hepatospenomegaly or mass.  No bruits. No CVA or flank tenderness. EXT: no clubbing or cyanosis.  He has 1+ left ankle pitting edema and trace right ankle pitting edema. Musculoskeletal: no joint swelling, erythema, warmth, or tenderness.  ROM of all joints intact. Skin - no sores or suspicious lesions or rashes or color changes  Pertinent labs:  Lab Results  Component Value Date   TSH 1.03 01/01/2020   Lab Results  Component Value Date   WBC 5.9 12/25/2020   HGB 14.8 12/25/2020   HCT 44.5 12/25/2020   MCV 92.9 12/25/2020   PLT 163.0 12/25/2020   Lab  Results  Component Value Date   CREATININE 1.12 06/10/2021   BUN 17 06/10/2021   NA 145 06/10/2021   K 3.9 06/10/2021   CL 107 06/10/2021   CO2 30 06/10/2021   Lab Results  Component Value Date   ALT 22 12/25/2020   AST 20 12/25/2020   ALKPHOS 70 12/25/2020   BILITOT 0.8 12/25/2020   Lab Results  Component Value Date   CHOL 192 12/25/2020   Lab Results  Component Value Date   HDL 52.00 12/25/2020   Lab Results  Component Value Date   LDLCALC 115 (H) 12/25/2020   Lab Results  Component Value Date   TRIG 122.0 12/25/2020   Lab Results  Component Value Date   CHOLHDL 4 12/25/2020   Lab Results  Component Value Date   PSA 1.38 12/25/2020   PSA 1.35 01/01/2020   PSA 1.44 12/22/2018   Lab Results  Component Value Date   HGBA1C 5.7 06/25/2020   ASSESSMENT AND PLAN:   #1 hypertension, well-controlled on amlodipine 10 mg a day, irbesartan 300 mg a day, and Toprol-XL 50 mg a day. Electrolytes and creatinine today.  2.  Kidney stones, recurrent. Currently with some mild symptoms on the right.  Urology plans to do ureteroscopy with stone extraction next week. I did prescribe Vicodin 5/325 for him to take 1-2 every 6 hours as needed, #10.  Health maintenance exam: Reviewed age and gender appropriate health maintenance issues (prudent diet, regular exercise,  health risks of tobacco and excessive alcohol, use of seatbelts, fire alarms in home, use of sunscreen).  Also reviewed age and gender appropriate health screening as well as vaccine recommendations. Vaccines: Tdap->deferred.  Otherwise UTD. Labs: CBC, c-Met, FLP, hemoglobin A1c, PSA. Prostate ca screening: PSA Colon ca screening: due for repeat iFOB after 06/2022  Of note, he will be retiring in November this year.  An After Visit Summary was printed and given to the patient.  FOLLOW UP:  Return in about 6 months (around 06/11/2022) for routine chronic illness f/u.  Signed:  Crissie Sickles, MD           12/09/2021

## 2021-12-09 NOTE — Patient Instructions (Signed)
Health Maintenance, Male Adopting a healthy lifestyle and getting preventive care are important in promoting health and wellness. Ask your health care provider about: The right schedule for you to have regular tests and exams. Things you can do on your own to prevent diseases and keep yourself healthy. What should I know about diet, weight, and exercise? Eat a healthy diet  Eat a diet that includes plenty of vegetables, fruits, low-fat dairy products, and lean protein. Do not eat a lot of foods that are high in solid fats, added sugars, or sodium. Maintain a healthy weight Body mass index (BMI) is a measurement that can be used to identify possible weight problems. It estimates body fat based on height and weight. Your health care provider can help determine your BMI and help you achieve or maintain a healthy weight. Get regular exercise Get regular exercise. This is one of the most important things you can do for your health. Most adults should: Exercise for at least 150 minutes each week. The exercise should increase your heart rate and make you sweat (moderate-intensity exercise). Do strengthening exercises at least twice a week. This is in addition to the moderate-intensity exercise. Spend less time sitting. Even light physical activity can be beneficial. Watch cholesterol and blood lipids Have your blood tested for lipids and cholesterol at 65 years of age, then have this test every 5 years. You may need to have your cholesterol levels checked more often if: Your lipid or cholesterol levels are high. You are older than 65 years of age. You are at high risk for heart disease. What should I know about cancer screening? Many types of cancers can be detected early and may often be prevented. Depending on your health history and family history, you may need to have cancer screening at various ages. This may include screening for: Colorectal cancer. Prostate cancer. Skin cancer. Lung  cancer. What should I know about heart disease, diabetes, and high blood pressure? Blood pressure and heart disease High blood pressure causes heart disease and increases the risk of stroke. This is more likely to develop in people who have high blood pressure readings or are overweight. Talk with your health care provider about your target blood pressure readings. Have your blood pressure checked: Every 3-5 years if you are 18-39 years of age. Every year if you are 40 years old or older. If you are between the ages of 65 and 75 and are a current or former smoker, ask your health care provider if you should have a one-time screening for abdominal aortic aneurysm (AAA). Diabetes Have regular diabetes screenings. This checks your fasting blood sugar level. Have the screening done: Once every three years after age 45 if you are at a normal weight and have a low risk for diabetes. More often and at a younger age if you are overweight or have a high risk for diabetes. What should I know about preventing infection? Hepatitis B If you have a higher risk for hepatitis B, you should be screened for this virus. Talk with your health care provider to find out if you are at risk for hepatitis B infection. Hepatitis C Blood testing is recommended for: Everyone born from 1945 through 1965. Anyone with known risk factors for hepatitis C. Sexually transmitted infections (STIs) You should be screened each year for STIs, including gonorrhea and chlamydia, if: You are sexually active and are younger than 65 years of age. You are older than 65 years of age and your   health care provider tells you that you are at risk for this type of infection. Your sexual activity has changed since you were last screened, and you are at increased risk for chlamydia or gonorrhea. Ask your health care provider if you are at risk. Ask your health care provider about whether you are at high risk for HIV. Your health care provider  may recommend a prescription medicine to help prevent HIV infection. If you choose to take medicine to prevent HIV, you should first get tested for HIV. You should then be tested every 3 months for as long as you are taking the medicine. Follow these instructions at home: Alcohol use Do not drink alcohol if your health care provider tells you not to drink. If you drink alcohol: Limit how much you have to 0-2 drinks a day. Know how much alcohol is in your drink. In the [place] one drink equals one 12 oz bottle of beer (355 mL), one 5 oz glass of wine (148 mL), or one 1 oz glass of hard liquor (44 mL). Lifestyle Do not use any products that contain nicotine or tobacco. These products include cigarettes, chewing tobacco, and vaping devices, such as e-cigarettes. If you need help quitting, ask your health care provider. Do not use street drugs. Do not share needles. Ask your health care provider for help if you need support or information about quitting drugs. General instructions Schedule regular health, dental, and eye exams. Stay current with your vaccines. Tell your health care provider if: You often feel depressed. You have ever been abused or do not feel safe at home. Summary Adopting a healthy lifestyle and getting preventive care are important in promoting health and wellness. Follow your health care provider's instructions about healthy diet, exercising, and getting tested or screened for diseases. Follow your health care provider's instructions on monitoring your cholesterol and blood pressure. This information is not intended to replace advice given to you by your health care provider. Make sure you discuss any questions you have with your health care provider. Document Revised: 09/08/2020 Document Reviewed: 09/08/2020 Elsevier Patient Education  2023 Elsevier Inc.  

## 2021-12-10 ENCOUNTER — Encounter: Payer: Self-pay | Admitting: Family Medicine

## 2022-02-01 ENCOUNTER — Other Ambulatory Visit: Payer: Self-pay | Admitting: Family Medicine

## 2022-02-01 NOTE — Telephone Encounter (Signed)
Requesting: Norco Contract: N/A UDS: N/A Last Visit: 12/09/21 CPE Next Visit: 06/11/22 Last Refill: 12/09/21(10,0)  Please Advise. Med pending

## 2022-02-19 ENCOUNTER — Other Ambulatory Visit: Payer: Self-pay | Admitting: Family Medicine

## 2022-02-19 NOTE — Telephone Encounter (Signed)
Requesting: Norco 5-325MG  Contract: N/A UDS: N/A  Last Visit: 12/09/21 Next Visit: 06/11/22 Last Refill: 02/01/22 (10,0)  Please Advise. Med pending

## 2022-04-06 ENCOUNTER — Other Ambulatory Visit: Payer: Self-pay | Admitting: Family Medicine

## 2022-04-06 NOTE — Telephone Encounter (Signed)
Requesting: Norco Contract: n/a UDS: n/a Last Visit: 12/09/21 Next Visit: 06/11/22 Last Refill: 02/19/22 (10,0)  Please Advise. Med pending

## 2022-04-07 NOTE — Telephone Encounter (Signed)
Pt advised refill sent. °

## 2022-06-08 ENCOUNTER — Other Ambulatory Visit: Payer: Self-pay | Admitting: Family Medicine

## 2022-06-11 ENCOUNTER — Ambulatory Visit (INDEPENDENT_AMBULATORY_CARE_PROVIDER_SITE_OTHER): Payer: Medicare Other | Admitting: Family Medicine

## 2022-06-11 ENCOUNTER — Encounter: Payer: Self-pay | Admitting: Family Medicine

## 2022-06-11 ENCOUNTER — Telehealth: Payer: Self-pay

## 2022-06-11 VITALS — BP 118/62 | HR 52 | Temp 97.9°F | Ht 70.5 in | Wt 201.6 lb

## 2022-06-11 DIAGNOSIS — E7800 Pure hypercholesterolemia, unspecified: Secondary | ICD-10-CM | POA: Diagnosis not present

## 2022-06-11 DIAGNOSIS — I10 Essential (primary) hypertension: Secondary | ICD-10-CM

## 2022-06-11 DIAGNOSIS — R7303 Prediabetes: Secondary | ICD-10-CM

## 2022-06-11 DIAGNOSIS — Z1211 Encounter for screening for malignant neoplasm of colon: Secondary | ICD-10-CM

## 2022-06-11 DIAGNOSIS — G894 Chronic pain syndrome: Secondary | ICD-10-CM | POA: Diagnosis not present

## 2022-06-11 DIAGNOSIS — N200 Calculus of kidney: Secondary | ICD-10-CM

## 2022-06-11 LAB — LIPID PANEL
Cholesterol: 164 mg/dL (ref 0–200)
HDL: 43.3 mg/dL (ref >39.00)
LDL Cholesterol: 104 mg/dL — ABNORMAL HIGH (ref 0–99)
NonHDL: 120.71
Total CHOL/HDL Ratio: 4
Triglycerides: 83 mg/dL (ref 0.0–149.0)
VLDL: 16.6 mg/dL (ref 0.0–40.0)

## 2022-06-11 LAB — HEMOGLOBIN A1C: Hgb A1c MFr Bld: 5.8 % (ref 4.6–6.5)

## 2022-06-11 LAB — COMPREHENSIVE METABOLIC PANEL
ALT: 13 U/L (ref 0–53)
AST: 11 U/L (ref 0–37)
Albumin: 4.1 g/dL (ref 3.5–5.2)
Alkaline Phosphatase: 70 U/L (ref 39–117)
BUN: 20 mg/dL (ref 6–23)
CO2: 26 mEq/L (ref 19–32)
Calcium: 9.3 mg/dL (ref 8.4–10.5)
Chloride: 107 mEq/L (ref 96–112)
Creatinine, Ser: 0.97 mg/dL (ref 0.40–1.50)
GFR: 82.08 mL/min (ref >60.00)
Glucose, Bld: 86 mg/dL (ref 70–99)
Potassium: 3.9 mEq/L (ref 3.5–5.1)
Sodium: 143 mEq/L (ref 135–145)
Total Bilirubin: 0.5 mg/dL (ref 0.2–1.2)
Total Protein: 6.3 g/dL (ref 6.0–8.3)

## 2022-06-11 MED ORDER — HYDROCODONE-ACETAMINOPHEN 5-325 MG PO TABS: ORAL_TABLET | ORAL | 0 refills | Status: DC

## 2022-06-11 MED ORDER — IRBESARTAN 300 MG PO TABS
ORAL_TABLET | ORAL | 3 refills | Status: DC
Start: 2022-06-11 — End: 2023-06-08

## 2022-06-11 MED ORDER — AMLODIPINE BESYLATE 10 MG PO TABS: ORAL_TABLET | ORAL | 3 refills | Status: DC

## 2022-06-11 MED ORDER — METOPROLOL SUCCINATE ER 50 MG PO TB24: ORAL_TABLET | ORAL | 3 refills | Status: DC

## 2022-06-11 MED ORDER — IBUPROFEN 800 MG PO TABS
ORAL_TABLET | ORAL | 1 refills | Status: DC
Start: 2022-06-11 — End: 2023-08-24

## 2022-06-11 NOTE — Progress Notes (Signed)
OFFICE VISIT  06/11/2022  CC:  Chief Complaint  Patient presents with   Medical Management of Chronic Issues    Pt is fasting    Patient is a 67 y.o. male who presents for 19-monthfollow-up hypertension, prediabetes, and recurrent kidney stones. A/P as of last visit: "#1 hypertension, well-controlled on amlodipine 10 mg a day, irbesartan 300 mg a day, and Toprol-XL 50 mg a day. Electrolytes and creatinine today.  2.  Kidney stones, recurrent. Currently with some mild symptoms on the right.  Urology plans to do ureteroscopy with stone extraction next week. I did prescribe Vicodin 5/325 for him to take 1-2 every 6 hours as needed, #10.   Health maintenance exam: Reviewed age and gender appropriate health maintenance issues (prudent diet, regular exercise, health risks of tobacco and excessive alcohol, use of seatbelts, fire alarms in home, use of sunscreen).  Also reviewed age and gender appropriate health screening as well as vaccine recommendations. Vaccines: Tdap->deferred.  Otherwise UTD. Labs: CBC, c-Met, FLP, hemoglobin A1c, PSA. Prostate ca screening: PSA Colon ca screening: due for repeat iFOB after 06/2022 Of note, he will be retiring in November this year."  INTERIM HX: Lipids a little elevated last visit so I recommended statin but he declined. He has retired.  Going okay with this. The last 6 months he has made some good dietary changes. Home blood pressures consistently 130/80 or better. Has not had any significant problems with kidney stones in the last few months.  He does feel like he passes a few small ones briefly fairly regularly, but nothing severe.  PMP AWARE reviewed today: most recent rx for Vicodin was filled 04/07/2022, # 20, rx by me. No red flags.   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 ROverton Mam   Outpatient Medications Prior to Visit  Medication Sig Dispense Refill   Multiple Vitamin (MULTIVITAMIN) tablet Take 1 tablet by mouth daily.     amLODipine (NORVASC) 10 MG tablet TAKE ONE (1) TABLET BY MOUTH EVERY DAY 90 tablet 3   cetirizine (ZYRTEC) 10 MG tablet Take 10 mg by mouth daily.     HYDROcodone-acetaminophen (NORCO/VICODIN) 5-325 MG tablet TAKE ONE TO TWO TABLETS BY MOUTH  EVERY SIX HOURS AS NEEDED 20 tablet 0   ibuprofen (ADVIL) 800 MG tablet TAKE ONE TABLET (800MG TOTAL) BY MOUTH EVERY 8 HOURS AS NEEDED 60 tablet 1   irbesartan (AVAPRO) 300 MG tablet TAKE ONE (1) TABLET BY MOUTH EVERY DAY 90 tablet 3   metoprolol succinate (TOPROL-XL) 50 MG 24 hr tablet TAKE 1 TABLET BY MOUTH DAILY WITH OR IMMEDIATELY FOLLOWING A MEAL 90 tablet 3   tamsulosin (FLOMAX) 0.4 MG CAPS capsule Take 0.4 mg by mouth daily.     No facility-administered medications prior to visit.    No Known Allergies  Review of Systems As per HPI  PE:     06/11/2022    8:16 AM 06/11/2022    8:00 AM 12/09/2021    8:02 AM  Vitals with BMI  Height  5' 10.5" 5' 10.5"  Weight  201 lbs 10 oz 224 lbs  BMI  [id number]  Systolic [id number]  Diastolic 62 77 74  Pulse  52 54  Initial bp today 147/77 Rpt 118/62  Physical Exam  Gen: Alert, well appearing.  Patient is oriented to person, place, time, and situation. AFFECT: pleasant, lucid thought and speech. CV: RRR, no m/r/g.   LUNGS: CTA bilat, nonlabored resps, good aeration in all lung fields. EXT: no clubbing or cyanosis.  no edema.    LABS:  Last CBC Lab Results  Component Value Date   WBC 4.9 12/09/2021   HGB 15.7 12/09/2021   HCT 46.3 12/09/2021   MCV 91.0 12/09/2021   MCH 31.0 10/20/2018   RDW 13.1 12/09/2021   PLT 156.0 123456   Last metabolic panel Lab Results  Component Value Date   GLUCOSE 95 12/09/2021   NA 144 12/09/2021   K 3.7 12/09/2021   CL 106 12/09/2021   CO2 30 12/09/2021   BUN 12 12/09/2021   CREATININE 1.01 12/09/2021   GFRNONAA >60 10/20/2018   CALCIUM 9.4 12/09/2021   PROT 6.9 12/09/2021   ALBUMIN 4.3 12/09/2021   BILITOT 0.9 12/09/2021   ALKPHOS 73 12/09/2021   AST 14 12/09/2021   ALT 15 12/09/2021   ANIONGAP 8 10/20/2018   Last lipids Lab Results  Component Value Date   CHOL 183 12/09/2021   HDL 46.20 12/09/2021   LDLCALC 115 (H) 12/09/2021   TRIG 113.0 12/09/2021   CHOLHDL 4 12/09/2021   Last hemoglobin A1c Lab Results  Component Value Date   HGBA1C 6.0 12/09/2021   Last thyroid functions Lab Results  Component Value Date   TSH 1.03 01/01/2020   IMPRESSION AND PLAN:  #1 hypertension, well-controlled on amlodipine 10 mg a day, irbesartan 300 mg a day, and Toprol-XL 50 mg a day. Check electrolytes and creatinine today.  2.  Recurrent kidney stones. Stable.  He has Flomax and Vicodin to use as needed. He is followed by urology. Vicodin 5-3 25, 1-2 p.o. every 6 hours as needed, #20 today.  #3 prediabetes. He has made some  good dietary changes.  Plans on starting the gym soon. Hemoglobin A1c and fasting glucose today.  4.  Hyperlipidemia.  Recommended statin last visit but he chose to make some dietary changes and hold off on statin.  He wishes to avoid this medicine if at all possible. Repeat lipid panel today.  5 colon cancer screening-->iFOB today.  An After Visit Summary was printed and given to the patient.  FOLLOW UP: Return in about 6 months (around 12/10/2022) for annual CPE (fasting).  Signed:  Crissie Sickles,  MD           06/11/2022

## 2022-06-11 NOTE — Telephone Encounter (Signed)
Spoke with pt to schedule AWV in office. Patient declined to schedule wellness visit at this time.   

## 2022-07-05 DIAGNOSIS — J069 Acute upper respiratory infection, unspecified: Secondary | ICD-10-CM | POA: Diagnosis not present

## 2022-08-11 NOTE — Telephone Encounter (Signed)
Pt will call to schedule.

## 2022-09-01 ENCOUNTER — Telehealth: Payer: Self-pay

## 2022-09-01 NOTE — Telephone Encounter (Signed)
No. Keep this as a physical. Thanks

## 2022-09-01 NOTE — Telephone Encounter (Signed)
Pt is due for a welcome to Medicare by 03/04/23. He is scheduled for a physical with you on 12/10/2022. Is it ok to change the appt to welcome to Medicare?

## 2022-09-21 NOTE — Telephone Encounter (Signed)
Scheduled Welcome to [facility] Physical with Dr. Milinda Cave on 10/08/22

## 2022-09-21 NOTE — Telephone Encounter (Signed)
Scheduled Welcome to Medicare Physical with Dr. McGowen on 10/08/22 

## 2022-09-22 DIAGNOSIS — N200 Calculus of kidney: Secondary | ICD-10-CM | POA: Diagnosis not present

## 2022-10-08 ENCOUNTER — Ambulatory Visit (INDEPENDENT_AMBULATORY_CARE_PROVIDER_SITE_OTHER): Payer: Medicare Other | Admitting: Family Medicine

## 2022-10-08 ENCOUNTER — Encounter: Payer: Self-pay | Admitting: Family Medicine

## 2022-10-08 VITALS — Ht 70.0 in | Wt 199.2 lb

## 2022-10-08 DIAGNOSIS — Z1211 Encounter for screening for malignant neoplasm of colon: Secondary | ICD-10-CM | POA: Diagnosis not present

## 2022-10-08 DIAGNOSIS — Z0000 Encounter for general adult medical examination without abnormal findings: Secondary | ICD-10-CM

## 2022-10-08 MED ORDER — HYDROCODONE-ACETAMINOPHEN 5-325 MG PO TABS: ORAL_TABLET | ORAL | 0 refills | Status: DC

## 2022-10-08 NOTE — Progress Notes (Signed)
WELCOME TO MEDICARE (IPPE) VISIT I explained that today's visit was for the purpose of health promotion and disease detection, as well as an introduction to Medicare and it's covered benefits.  I explained that no labs or other services would be performed today, but if any were determined to be necessary then appropriate orders/referrals would be arranged for these to be done at a future date.  Patient is a 68 y/o male who is already an established pt with me.  Pt's medical and social history were reviewed. Specifically, we reviewed PMH/PSH/Meds/FH.  Also reviewed alcohol, tobacco, and illicit drug use.  Diet and physical activity reviewed.   All of this info is also found in the appropriate sections of pt's EMR.  Pt was screened with appropriate screening instrument for depression.  Current or past experiences with mood disorders was discussed.     10/08/2022    1:20 PM  Depression screen PHQ 2/9  Decreased Interest 0  Down, Depressed, Hopeless 0  PHQ - 2 Score 0      10/20/2018    5:42 PM 09/15/2020    9:49 AM 06/10/2022    9:17 AM 10/07/2022    9:04 AM 10/08/2022    1:20 PM  Fall Risk  Falls in the past year?   0 0 0  Was there an injury with Fall?     0  Fall Risk Category Calculator     0  (RETIRED) Patient Fall Risk Level Low fall risk Low fall risk     Patient at Risk for Falls Due to     No Fall Risks  Fall risk Follow up     Education provided   Pt's functional ability and level of safety were reviewed. Specifically, I screened for hearing impairment and fall risk.  I assessed home safety and we discussed pt's competency with activities of daily living.  Fully independent.  EXAM: There were no vitals filed for this visit. Body mass index is 28.58 kg/m. Hearing Screening   500Hz  1000Hz  2000Hz  3000Hz  4000Hz   Right ear 25 25 25 25 25   Left ear 25 25 25 25 25    Vision Screening   Right eye Left eye Both eyes  Without correction 20/25 20/25 20/20   With correction        Pt gets routine eye care through optometrist/ophthalmologist and is up to date with follow up care with this provider. No additional physical exam required or indicated today.  End of life planning: Advanced directives and power of attorney information specific to the patient were discussed.    Education, counseling, and referral for other preventive services: Written checklist was completed and given to pt for obtaining, as appropriate, the other preventive services that are covered as separate Medicare Part B benefits. Possible services that were reviewed with pt are:  -annual wellness visit (AWV) -Bone mass measurements -Cardiovascular screening blood tests -Colorectal cancer screening: pt has never had colonoscopy.  iFOB was given 06/11/22 and he returned this today. -Counseling to prevent tobacco use -Diabetes screening tests -Diabetes self-management training (DSMT) -Glaucoma screening -HIV screening -Medical nutrition therapy -Prostate cancer screening: next PSA due Aug 2024. -Seasonal influenza, pneumococcal, and Hep B vaccines:  pt declines -screening mammography: n/a -screening pap tests and pelvic exam: n/a -ultrasound screening for AAA: pt does not qualify  Of the above listed services, no referrals were made today.  Patient did have an additional complaint/problem that was discussed and evaluated today. Recent URI with cough, gradually improving but some  feeling of some postnasal drip in the back of his throat/tickle.  Some productive cough mostly in the mornings. No shortness of breath, wheezing, chest pain, fever, face pain, or sinus congestion. Lungs are clear bilaterally, he has regular rhythm and rate without murmur. Reassured, suspect resolving viral URI.  He has Mucinex that he will continue taking as needed.  I renewed his Vicodin, #20--> he has history of recurrent kidney stones and uses these sparingly when he is passing a stone.  Patient was given  opportunity to ask any additional questions regarding Medicare and covered benefits.  Patient was informed that Medicare does not provide coverage for routine physical exams.  I answered all questions to the best of my ability today.    No labs needed today.  Follow up: No follow-ups on file. Next cpe after 12/10/22 Signed:  Santiago Bumpers, MD           10/08/2022

## 2022-10-12 ENCOUNTER — Encounter: Payer: Self-pay | Admitting: Family Medicine

## 2022-10-12 LAB — FECAL OCCULT BLOOD, IMMUNOCHEMICAL: Fecal Occult Bld: NEGATIVE

## 2022-10-13 DIAGNOSIS — K4090 Unilateral inguinal hernia, without obstruction or gangrene, not specified as recurrent: Secondary | ICD-10-CM | POA: Diagnosis not present

## 2022-10-13 DIAGNOSIS — N139 Obstructive and reflux uropathy, unspecified: Secondary | ICD-10-CM | POA: Diagnosis not present

## 2022-10-13 DIAGNOSIS — N200 Calculus of kidney: Secondary | ICD-10-CM | POA: Diagnosis not present

## 2022-10-14 ENCOUNTER — Telehealth: Payer: Medicare Other | Admitting: Family Medicine

## 2022-10-14 ENCOUNTER — Telehealth: Payer: Self-pay | Admitting: Family Medicine

## 2022-10-14 DIAGNOSIS — J019 Acute sinusitis, unspecified: Secondary | ICD-10-CM

## 2022-10-14 DIAGNOSIS — B9689 Other specified bacterial agents as the cause of diseases classified elsewhere: Secondary | ICD-10-CM | POA: Diagnosis not present

## 2022-10-14 MED ORDER — AMOXICILLIN-POT CLAVULANATE 875-125 MG PO TABS: 1.0000 | ORAL_TABLET | Freq: Two times a day (BID) | ORAL | 0 refills | Status: AC

## 2022-10-14 NOTE — Telephone Encounter (Signed)
Patient called and stated he is having symptoms of a chest cold. He was last here 6/7 for a welcome to medicare visit. He mentioned that he discussed his symptoms at the time with Dr. Milinda Cave. I informed him he would need to be seen to re-evaluate his symptoms since he reports symptoms worsening. He stated he was not going to do that and that if we could not help him he would find another Dr. I asked him would he like to go to another office and he once again declined and said he would take care of it. He refused to be seen by Dr. Milinda Cave or any other office. He said that Dr. Milinda Cave was aware of his issues and that all he needs to do is call him in something.

## 2022-10-14 NOTE — Telephone Encounter (Signed)
Noted  

## 2022-10-14 NOTE — Progress Notes (Signed)

## 2022-12-09 NOTE — Patient Instructions (Signed)
It was very nice to see you today!   PLEASE NOTE:    Please give ample time to the testing facility, and our office to run, receive and review results. Please do not call inquiring of results, even if you can see them in your chart. We will contact you as soon as we are able. If it has been over 1 week since the test was completed, and you have not yet heard from [place], then please call us.   If we ordered any referrals today, please let us know if you have not heard from their office within the next 2 weeks. You should receive a letter via MyChart confirming if the referral was approved and their office contact information to schedule.   Please try these tips to maintain a healthy lifestyle:  Eat most of your calories during the day when you are active. Eliminate processed foods including packaged sweets (pies, cakes, cookies), reduce intake of potatoes, white bread, white pasta, and white rice. Look for whole grain options, oat flour or almond flour.  Each meal should contain half fruits/vegetables, one quarter protein, and one quarter carbs (no bigger than a computer mouse).  Cut down on sweet beverages. This includes juice, soda, and sweet tea. Also watch fruit intake, though this is a healthier sweet option, it still contains natural sugar! Limit to 3 servings daily.  Drink at least 1 glass of water with each meal and aim for at least 8 glasses per day  Exercise at least 150 minutes every week.

## 2022-12-10 ENCOUNTER — Encounter: Payer: Self-pay | Admitting: Family Medicine

## 2022-12-10 ENCOUNTER — Ambulatory Visit (INDEPENDENT_AMBULATORY_CARE_PROVIDER_SITE_OTHER): Payer: Medicare Other | Admitting: Family Medicine

## 2022-12-10 VITALS — BP 133/76 | HR 50 | Ht 70.0 in | Wt 199.4 lb

## 2022-12-10 DIAGNOSIS — E7800 Pure hypercholesterolemia, unspecified: Secondary | ICD-10-CM | POA: Diagnosis not present

## 2022-12-10 DIAGNOSIS — R7303 Prediabetes: Secondary | ICD-10-CM

## 2022-12-10 DIAGNOSIS — I10 Essential (primary) hypertension: Secondary | ICD-10-CM

## 2022-12-10 DIAGNOSIS — Z0000 Encounter for general adult medical examination without abnormal findings: Secondary | ICD-10-CM | POA: Diagnosis not present

## 2022-12-10 DIAGNOSIS — Z125 Encounter for screening for malignant neoplasm of prostate: Secondary | ICD-10-CM | POA: Diagnosis not present

## 2022-12-10 LAB — CBC WITH DIFFERENTIAL/PLATELET
Basophils Absolute: 0.2 10*3/uL — ABNORMAL HIGH (ref 0.0–0.1)
Basophils Relative: 3.4 % — ABNORMAL HIGH (ref 0.0–3.0)
Eosinophils Absolute: 0.2 10*3/uL (ref 0.0–0.7)
Eosinophils Relative: 2.6 % (ref 0.0–5.0)
HCT: 44.5 % (ref 39.0–52.0)
Hemoglobin: 14.5 g/dL (ref 13.0–17.0)
Lymphocytes Relative: 31.4 % (ref 12.0–46.0)
Lymphs Abs: 1.8 10*3/uL (ref 0.7–4.0)
MCHC: 32.6 g/dL (ref 30.0–36.0)
MCV: 92.9 fl (ref 78.0–100.0)
Monocytes Absolute: 0.7 10*3/uL (ref 0.1–1.0)
Monocytes Relative: 11.3 % (ref 3.0–12.0)
Neutro Abs: 3 10*3/uL (ref 1.4–7.7)
Neutrophils Relative %: 51.3 % (ref 43.0–77.0)
Platelets: 134 10*3/uL — ABNORMAL LOW (ref 150.0–400.0)
RBC: 4.79 Mil/uL (ref 4.22–5.81)
RDW: 13 % (ref 11.5–15.5)
WBC: 5.8 10*3/uL (ref 4.0–10.5)

## 2022-12-10 LAB — LIPID PANEL
Cholesterol: 162 mg/dL (ref 0–200)
HDL: 49.1 mg/dL (ref >39.00)
LDL Cholesterol: 91 mg/dL (ref 0–99)
NonHDL: 112.99
Total CHOL/HDL Ratio: 3
Triglycerides: 111 mg/dL (ref 0.0–149.0)
VLDL: 22.2 mg/dL (ref 0.0–40.0)

## 2022-12-10 LAB — COMPREHENSIVE METABOLIC PANEL
ALT: 13 U/L (ref 0–53)
AST: 15 U/L (ref 0–37)
Albumin: 4.1 g/dL (ref 3.5–5.2)
Alkaline Phosphatase: 66 U/L (ref 39–117)
BUN: 17 mg/dL (ref 6–23)
CO2: 27 mEq/L (ref 19–32)
Calcium: 9.7 mg/dL (ref 8.4–10.5)
Chloride: 105 mEq/L (ref 96–112)
Creatinine, Ser: 1.01 mg/dL (ref 0.40–1.50)
GFR: 77.93 mL/min (ref >60.00)
Glucose, Bld: 93 mg/dL (ref 70–99)
Potassium: 4.1 mEq/L (ref 3.5–5.1)
Sodium: 139 mEq/L (ref 135–145)
Total Bilirubin: 0.7 mg/dL (ref 0.2–1.2)
Total Protein: 6.6 g/dL (ref 6.0–8.3)

## 2022-12-10 LAB — PSA, MEDICARE: PSA: 1.93 ng/ml (ref 0.10–4.00)

## 2022-12-10 LAB — POCT GLYCOSYLATED HEMOGLOBIN (HGB A1C)
HbA1c POC (<> result, manual entry): 5.4 % (ref 4.0–5.6)
HbA1c, POC (controlled diabetic range): 5.4 % (ref 0.0–7.0)
HbA1c, POC (prediabetic range): 5.4 % — AB (ref 5.7–6.4)
Hemoglobin A1C: 5.4 % (ref 4.0–5.6)

## 2022-12-10 NOTE — Progress Notes (Signed)
Office Note 12/10/2022  CC:  Chief Complaint  Patient presents with   Annual Exam    Pt is fasting.    HPI:  Patient is a 67 y.o. male who is here for annual health maintenance exam and follow-up hypertension, hypercholesterolemia, and prediabetes.  Enjoying retirement so far, keeping very busy.  No home blood pressure monitoring. He is eating a good diet and remains very active.  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 Lonny Prude    History reviewed. No pertinent family history.  Social History   Socioeconomic History   Marital status: Married    Spouse name: Not on file   Number of children: Not on file   Years of education: Not on file   Highest education level: Bachelor's degree (e.g., BA, AB, BS)  Occupational History   Not on file  Tobacco Use   Smoking  status: Never   Smokeless tobacco: Current    Types: Snuff  Vaping Use   Vaping status: Never Used  Substance and Sexual Activity   Alcohol use: No   Drug use: No   Sexual activity: Not on file  Other Topics Concern   Not on file  Social History Narrative   Married, one 63 yr old son.   Lives in [place], [place].  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   Never smoker, no ETOH, no drugs.      Social Determinants of Health   Financial Resource Strain: Low Risk  (06/10/2022)   Overall Financial Resource Strain (CARDIA)    Difficulty of Paying Living Expenses: Not hard at all  Food Insecurity: Unknown (06/10/2022)   Hunger Vital Sign    Worried About Running Out of Food in the Last Year: Never true    Ran Out of Food in the Last Year: Patient declined  Transportation Needs: Patient Declined (06/10/2022)   PRAPARE - Administrator, Civil Service (Medical): Patient declined    Lack of Transportation (Non-Medical): Patient declined  Physical Activity: Sufficiently Active (06/10/2022)   Exercise Vital Sign    Days of Exercise per Week: 6 days    Minutes of Exercise per Session: 30 min  Stress: No  Stress Concern Present (06/10/2022)   [employer] of Occupational Health - Occupational Stress Questionnaire    Feeling of Stress : Not at all  Social Connections: Moderately Integrated (06/10/2022)   Social Connection and Isolation Panel [NHANES]    Frequency of Communication with Friends and Family: More than three times a week    Frequency of Social Gatherings with Friends and Family: Patient declined    Attends Religious Services: More than 4 times per year    Active Member of [employer] or Organizations: No    Attends Engineer, structural: Not on file    Marital Status: Married  Intimate Partner Violence: Unknown (08/05/2021)   Received from [employer], [facility]   HITS    Physically Hurt: Not on file    Insult or Talk Down To: Not on file    Threaten  Physical Harm: Not on file    Scream or Curse: Not on file    Outpatient Medications Prior to Visit  Medication Sig Dispense Refill   amLODipine (NORVASC) 10 MG tablet TAKE ONE (1) TABLET BY MOUTH EVERY DAY 90 tablet 3   aspirin EC 81 MG tablet Take 81 mg by mouth daily. Swallow whole.     HYDROcodone-acetaminophen (NORCO/VICODIN) 5-325 MG tablet TAKE ONE TO TWO TABLETS BY MOUTH EVERY SIX HOURS AS NEEDED 20 tablet 0   ibuprofen (ADVIL) 800 MG tablet TAKE ONE TABLET (800MG  TOTAL) BY MOUTH EVERY 8 HOURS AS NEEDED 60 tablet 1   irbesartan (AVAPRO) 300 MG tablet TAKE ONE (1) TABLET BY MOUTH EVERY DAY 90 tablet 3   metoprolol succinate (TOPROL-XL) 50 MG 24 hr tablet TAKE 1 TABLET BY MOUTH DAILY WITH OR IMMEDIATELY FOLLOWING A MEAL 90 tablet 3   Multiple Vitamin (MULTIVITAMIN) tablet Take 1 tablet by mouth daily.     No facility-administered medications prior to visit.    No Known Allergies  Review of Systems  Constitutional:  Negative for appetite change, chills, fatigue and fever.  HENT:  Negative for congestion, dental problem, ear pain and sore throat.   Eyes:  Negative for discharge, redness and visual disturbance.  Respiratory:  Negative for cough, chest tightness, shortness of breath and wheezing.   Cardiovascular:  Negative for chest pain, palpitations and leg swelling.  Gastrointestinal:  Negative for abdominal pain, blood in stool, diarrhea, nausea and vomiting.  Genitourinary:  Negative for difficulty urinating, dysuria, flank pain, frequency, hematuria and urgency.  Musculoskeletal:  Negative for arthralgias, back pain, joint swelling, myalgias and neck stiffness.  Skin:  Negative for pallor and rash.  Neurological:  Negative for dizziness, speech difficulty, weakness and headaches.  Hematological:  Negative for adenopathy. Does not bruise/bleed easily.  Psychiatric/Behavioral:  Negative for confusion and sleep disturbance. The patient is not nervous/anxious.     PE;     12/10/2022    9:33 AM 10/08/2022    1:15 PM 06/11/2022    8:16 AM  Vitals with BMI  Height 5\' 10"  5\' 10"    Weight 199 lbs 6 oz 199 lbs 3 oz   BMI 28.61 28.58   Systolic 133  118  Diastolic 76  62  Pulse 50     Gen: Alert, well appearing.  Patient is oriented to person, place, time, and situation. AFFECT: pleasant, lucid thought and speech. ENT: Ears: EACs clear, normal epithelium.  TMs with good light reflex and landmarks bilaterally.  Eyes: no injection, icteris, swelling, or exudate.  EOMI, PERRLA. Nose: no drainage or turbinate edema/swelling.  No injection  or focal lesion.  Mouth: lips without lesion/swelling.  Oral mucosa pink and moist.  Dentition intact and without obvious caries or gingival swelling.  Oropharynx without erythema, exudate, or swelling.  Neck: supple/nontender.  No LAD, mass, or TM.  Carotid pulses 2+ bilaterally, without bruits. CV: RRR, no m/r/g.   LUNGS: CTA bilat, nonlabored resps, good aeration in all lung fields. ABD: soft, NT, ND, BS normal.  No hepatospenomegaly or mass.  No bruits. EXT: no clubbing, cyanosis, or edema.  Musculoskeletal: no joint swelling, erythema, warmth, or tenderness.  ROM of all joints intact. Skin - no sores or suspicious lesions or rashes or color changes  Pertinent labs:  Lab Results  Component Value Date   TSH 1.03 01/01/2020   Lab Results  Component Value Date   WBC 4.9 12/09/2021   HGB 15.7 12/09/2021   HCT 46.3 12/09/2021   MCV 91.0 12/09/2021   PLT 156.0 12/09/2021   Lab Results  Component Value Date   CREATININE 0.97 06/11/2022   BUN 20 06/11/2022   NA 143 06/11/2022   K 3.9 06/11/2022   CL 107 06/11/2022   CO2 26 06/11/2022   Lab Results  Component Value Date   ALT 13 06/11/2022   AST 11 06/11/2022   ALKPHOS 70 06/11/2022   BILITOT 0.5 06/11/2022   Lab Results  Component Value Date   CHOL 164 06/11/2022   Lab Results  Component Value Date   HDL 43.30 06/11/2022   Lab Results  Component Value Date    LDLCALC 104 (H) 06/11/2022   Lab Results  Component Value Date   TRIG 83.0 06/11/2022   Lab Results  Component Value Date   CHOLHDL 4 06/11/2022   Lab Results  Component Value Date   PSA 1.56 12/09/2021   PSA 1.38 12/25/2020   PSA 1.35 01/01/2020   Lab Results  Component Value Date   HGBA1C 5.8 06/11/2022   ASSESSMENT AND PLAN:   #1 health maintenance exam: Reviewed age and gender appropriate health maintenance issues (prudent diet, regular exercise, health risks of tobacco and excessive alcohol, use of seatbelts, fire alarms in home, use of sunscreen).  Also reviewed age and gender appropriate health screening as well as vaccine recommendations. Vaccines: All UTD. Labs: CBC, c-Met, FLP, hemoglobin A1c, PSA. Prostate ca screening: PSA Colon ca screening: iFOB neg 10/2022.  Rpt approx 10/2023.  #2 prediabetes. Doing great with diet. POC Hba1c today is 5.4%.  #3 hypertension, well-controlled on amlodipine 10 mg a day, irbesartan 300 mg a day, and Toprol-XL 50 mg a day. Electrolytes and creatinine today.  #4 hypercholesterolemia.  2023 Framingham risk is 12%, recommended statin, pt declined  Lipid panel and hepatic panel today.  An After Visit Summary was printed and given to the patient.  FOLLOW UP:  Return in about 6 months (around 06/12/2023) for routine chronic illness f/u.  Signed:  Santiago Bumpers, MD           12/10/2022

## 2023-01-04 ENCOUNTER — Other Ambulatory Visit: Payer: Self-pay | Admitting: Family Medicine

## 2023-04-14 DIAGNOSIS — N200 Calculus of kidney: Secondary | ICD-10-CM | POA: Diagnosis not present

## 2023-06-09 ENCOUNTER — Encounter: Payer: Self-pay | Admitting: Family Medicine

## 2023-06-09 ENCOUNTER — Ambulatory Visit (INDEPENDENT_AMBULATORY_CARE_PROVIDER_SITE_OTHER): Payer: Medicare Other | Admitting: Family Medicine

## 2023-06-09 VITALS — BP 138/80 | HR 50 | Ht 70.0 in | Wt 203.4 lb

## 2023-06-09 DIAGNOSIS — I10 Essential (primary) hypertension: Secondary | ICD-10-CM

## 2023-06-09 DIAGNOSIS — R7303 Prediabetes: Secondary | ICD-10-CM

## 2023-06-09 DIAGNOSIS — E7800 Pure hypercholesterolemia, unspecified: Secondary | ICD-10-CM | POA: Diagnosis not present

## 2023-06-09 LAB — BASIC METABOLIC PANEL
BUN: 17 mg/dL (ref 6–23)
CO2: 29 meq/L (ref 19–32)
Calcium: 9.2 mg/dL (ref 8.4–10.5)
Chloride: 107 meq/L (ref 96–112)
Creatinine, Ser: 1.03 mg/dL (ref 0.40–1.50)
GFR: 75.85 mL/min (ref >60.00)
Glucose, Bld: 95 mg/dL (ref 70–99)
Potassium: 4.4 meq/L (ref 3.5–5.1)
Sodium: 142 meq/L (ref 135–145)

## 2023-06-09 MED ORDER — AMLODIPINE BESYLATE 10 MG PO TABS: ORAL_TABLET | ORAL | 3 refills | Status: AC

## 2023-06-09 MED ORDER — METOPROLOL SUCCINATE ER 50 MG PO TB24: ORAL_TABLET | ORAL | 3 refills | Status: AC

## 2023-06-09 MED ORDER — IRBESARTAN 300 MG PO TABS: ORAL_TABLET | ORAL | 3 refills | Status: AC

## 2023-06-09 MED ORDER — HYDROCODONE-ACETAMINOPHEN 5-325 MG PO TABS: ORAL_TABLET | ORAL | 0 refills | Status: DC

## 2023-06-09 NOTE — Progress Notes (Signed)
 OFFICE VISIT  06/09/2023  CC:  Chief Complaint  Patient presents with   Medical Management of Chronic Issues    Pt is fasting    Patient is a 68 y.o. male who presents for 59-month follow-up hypertension. A/P as of last visit: #1 prediabetes. Doing great with diet. POC Hba1c today is 5.4%.   #2 hypertension, well-controlled on amlodipine  10 mg a day, irbesartan  300 mg a day, and Toprol -XL 50 mg a day. Electrolytes and creatinine today.   #3 hypercholesterolemia.   2023 Framingham risk is 12%, recommended statin, pt declined  Lipid panel and hepatic panel today.  INTERIM HX: All labs excellent last visit. He occasionally checks blood pressure at home and gets around 130s over 80s.  Still has some episodes of kidney stones.  Currently feels a bit of pain on the right side intermittently.  He rarely uses hydrocodone  for the pain. He gets annual follow-up with his urologist, has been diagnosed with calcium oxalate stones.  He is taking focus factor lately and has noted a lot of improvement.   PMP AWARE reviewed today: most recent rx for Vicodin was filled 01/05/2023, # 20, rx by me. No red flags.  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 Leigh    Outpatient Medications Prior to Visit  Medication Sig Dispense Refill   aspirin  EC 81 MG tablet Take 81 mg by mouth daily. Swallow whole.     ibuprofen  (ADVIL ) 800 MG tablet TAKE ONE TABLET (800MG  TOTAL) BY MOUTH EVERY 8 HOURS AS NEEDED 60 tablet 1   Multiple Vitamin (MULTIVITAMIN) tablet Take 1 tablet by mouth daily.     HYDROcodone -acetaminophen  (NORCO/VICODIN) 5-325 MG tablet TAKE ONE TO TWO TABLETS BY MOUTH EVERY SIX HOURS AS NEEDED. 20 tablet 0   amLODipine  (NORVASC ) 10 MG tablet TAKE ONE (1) TABLET BY MOUTH EVERY DAY 90 tablet 3   irbesartan  (AVAPRO ) 300 MG tablet TAKE ONE (1) TABLET BY MOUTH EVERY DAY 90 tablet 3   metoprolol  succinate (TOPROL -XL) 50 MG 24 hr tablet TAKE 1 TABLET BY MOUTH DAILY WITH OR IMMEDIATELY FOLLOWING A MEAL 90 tablet 3   No facility-administered medications prior to visit.    No Known  Allergies  Review of Systems As per HPI  PE:    06/09/2023    9:19 AM 06/09/2023    9:04 AM 12/10/2022    9:33 AM  Vitals with BMI  Height  5' 10 5' 10  Weight  203 lbs 6 oz 199 lbs 6 oz  BMI  29.18 28.61  Systolic 138 143 866  Diastolic 80 71 76  Pulse  50 50     Physical Exam  Gen: Alert, well appearing.  Patient is oriented to person, place, time, and situation. AFFECT: pleasant, lucid thought and speech. No further exam today  LABS:  Last CBC Lab Results  Component Value Date   WBC 5.8 12/10/2022   HGB 14.5 12/10/2022   HCT 44.5 12/10/2022   MCV 92.9 12/10/2022   MCH 31.0 10/20/2018   RDW 13.0 12/10/2022   PLT 134.0 (L) 12/10/2022   Last metabolic panel Lab Results   Component Value Date   GLUCOSE 93 12/10/2022   NA 139 12/10/2022   K 4.1 12/10/2022   CL 105 12/10/2022   CO2 27 12/10/2022   BUN 17 12/10/2022   CREATININE 1.01 12/10/2022   GFR 77.93 12/10/2022   CALCIUM 9.7 12/10/2022   PROT 6.6 12/10/2022   ALBUMIN 4.1 12/10/2022   BILITOT 0.7 12/10/2022   ALKPHOS 66 12/10/2022   AST 15 12/10/2022   ALT 13 12/10/2022   ANIONGAP 8 10/20/2018   Last lipids Lab Results  Component Value Date   CHOL 162 12/10/2022   HDL 49.10 12/10/2022   LDLCALC 91 12/10/2022   TRIG 111.0 12/10/2022   CHOLHDL 3 12/10/2022   Last hemoglobin A1c Lab Results  Component Value Date   HGBA1C 5.4 12/10/2022   HGBA1C 5.4 12/10/2022   HGBA1C 5.4 (A) 12/10/2022   HGBA1C 5.4 12/10/2022   Last thyroid  functions Lab Results  Component Value Date   TSH 1.03 01/01/2020   Lab Results  Component Value Date   PSA 1.93 12/10/2022   PSA 1.56 12/09/2021   PSA 1.38 12/25/2020   IMPRESSION AND PLAN:  #1 hypertension, well-controlled on amlodipine  10 mg a day, irbesartan  300 mg a day, and Toprol -XL 50 mg a day. Electrolytes and creatinine today.   #2 prediabetes. Doing great with diet. POC Hba1c 6 months ago was down to 5.4%. Checking fasting glucose today.  #3 hypercholesterolemia.   2023 Framingham risk is 12%, recommended statin, pt declined  LDL was improved to 91 6 months ago. Will check lipid panel annually (next in 6 mo).  An After Visit Summary was printed and given to the patient.  FOLLOW UP: Return in about 6 months (around 12/07/2023) for annual CPE (fasting). Cpe 6 mo Signed:  Gerlene Hockey, MD           06/09/2023

## 2023-06-10 ENCOUNTER — Ambulatory Visit: Payer: Medicare Other | Admitting: Family Medicine

## 2023-06-22 ENCOUNTER — Other Ambulatory Visit: Payer: Self-pay | Admitting: Family Medicine

## 2023-06-24 ENCOUNTER — Other Ambulatory Visit: Payer: Self-pay | Admitting: Family Medicine

## 2023-06-24 NOTE — Telephone Encounter (Signed)
 Copied from CRM [id number]. Topic: Clinical - Medication Refill >> Jun 24, 2023  1:46 PM Sonny Dandy B wrote: Most Recent Primary Care Visit:  Provider: Jeoffrey Massed  Department: [facility]  Visit Type: OFFICE VISIT  Date: 06/09/2023  Medication: ibuprofen (ADVIL) 800 MG tablet  Has the patient contacted their pharmacy? Yes (Agent: If no, request that the patient contact the pharmacy for the refill. If patient does not wish to contact the pharmacy document the reason why and proceed with request.) (Agent: If yes, when and what did the pharmacy advise?)  Is this the correct pharmacy for this prescription? Yes If no, delete pharmacy and type the correct one.  This is the patient's preferred pharmacy:  [facility] - [place], [place] - [address] [place] [address] Phone: [phone] Fax: [phone]   Has the prescription been filled recently? Yes  Is the patient out of the medication? Yes  Has the patient been seen for an appointment in the last year OR does the patient have an upcoming appointment? Yes  Can we respond through MyChart? No  Agent: Please be advised that Rx refills may take up to 3 business days. We ask that you follow-up with your pharmacy.

## 2023-08-24 ENCOUNTER — Other Ambulatory Visit: Payer: Self-pay | Admitting: Family Medicine

## 2023-08-29 DIAGNOSIS — K080 Exfoliation of teeth due to systemic causes: Secondary | ICD-10-CM | POA: Diagnosis not present

## 2023-09-12 DIAGNOSIS — K080 Exfoliation of teeth due to systemic causes: Secondary | ICD-10-CM | POA: Diagnosis not present

## 2023-10-26 ENCOUNTER — Ambulatory Visit

## 2023-10-26 VITALS — Ht 70.0 in | Wt 170.0 lb

## 2023-10-26 DIAGNOSIS — Z0000 Encounter for general adult medical examination without abnormal findings: Secondary | ICD-10-CM | POA: Diagnosis not present

## 2023-10-26 NOTE — Progress Notes (Signed)
 Subjective:   Christopher Hunter is a 68 y.o. male who presents for Medicare Annual/Subsequent preventive examination.  Visit Complete: Virtual I connected with  Shihab A Leeth on 10/26/23 by a audio enabled telemedicine application and verified that I am speaking with the correct person using two identifiers.  Patient Location: Home  Provider Location: Home Office  I discussed the limitations of evaluation and management by telemedicine. The patient expressed understanding and agreed to proceed.  Vital Signs: Because this visit was a virtual/telehealth visit, some criteria may be missing or patient reported. Any vitals not documented were not able to be obtained and vitals that have been documented are patient reported.  Cardiac Risk Factors include: advanced age (>68men, >23 women);male gender;hypertension     Objective:    Today's Vitals   10/26/23 1518  Weight: 170 lb (77.1 kg)  Height: 5' 10 (1.778 m)   Body mass index is 24.39 kg/m.     10/26/2023    3:15 PM 10/08/2022    1:20 PM 09/15/2020    9:46 AM 10/20/2018    5:41 PM 12/23/2017    7:12 PM  Advanced Directives  Does Patient Have a Medical Advance Directive? No No No No No   Would patient like information on creating a medical advance directive? No - Patient declined Yes (MAU/Ambulatory/Procedural Areas - Information given) Yes (MAU/Ambulatory/Procedural Areas - Information given)  No - Patient declined      Data saved with a previous flowsheet row definition    Current Medications (verified) Outpatient Encounter Medications as of 10/26/2023  Medication Sig   amLODipine  (NORVASC ) 10 MG tablet TAKE ONE (1) TABLET BY MOUTH EVERY DAY   aspirin  EC 81 MG tablet Take 81 mg by mouth daily. Swallow whole.   HYDROcodone -acetaminophen  (NORCO/VICODIN) 5-325 MG tablet TAKE ONE TO TWO TABLETS BY MOUTH EVERY SIX HOURS AS NEEDED   ibuprofen  (ADVIL ) 800 MG tablet TAKE ONE TABLET (800MG  TOTAL) BY MOUTH EVERY 8 HOURS AS NEEDED    irbesartan  (AVAPRO ) 300 MG tablet TAKE ONE (1) TABLET BY MOUTH EVERY DAY   metoprolol  succinate (TOPROL -XL) 50 MG 24 hr tablet TAKE 1 TABLET BY MOUTH DAILY WITH OR IMMEDIATELY FOLLOWING A MEAL   Multiple Vitamin (MULTIVITAMIN) tablet Take 1 tablet by mouth daily.   No facility-administered encounter medications on file as of 10/26/2023.    Allergies (verified) Patient has no known allergies.   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 Leigh   No family history on file. Social History   Socioeconomic History   Marital status: Married    Spouse name: Not on file   Number of children: Not on file   Years of education: Not on file   Highest education level: Bachelor's degree (e.g., BA, AB, BS)  Occupational History   Not on file  Tobacco Use   Smoking status: Never    Smokeless tobacco: Current    Types: Snuff  Vaping Use   Vaping status: Never Used  Substance and Sexual Activity   Alcohol use: No   Drug use: No   Sexual activity: Not on file  Other Topics Concern   Not on file  Social History Narrative   Married, one 90 yr old son.   Lives in [place], [place].  Worked as Naval architect, Designer, multimedia.  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   Never smoker, no ETOH, no drugs.      Social Drivers of Corporate investment banker Strain: Low Risk  (10/26/2023)   Overall Financial Resource Strain (CARDIA)    Difficulty of Paying Living Expenses: Not hard at all  Food Insecurity: No Food Insecurity (10/26/2023)   Hunger Vital Sign    Worried About Running Out of Food in the Last Year: Never true    Ran Out of Food in the Last Year: Never true  Transportation Needs: No Transportation Needs (10/26/2023)   PRAPARE - Administrator, Civil Service (Medical): No    Lack of Transportation (Non-Medical): No  Physical Activity: Inactive (10/26/2023)   Exercise Vital Sign    Days of Exercise per Week: 0 days    Minutes of Exercise per Session: 0 min  Stress: No Stress Concern Present (10/26/2023)   [employer] of Occupational Health - Occupational Stress Questionnaire    Feeling of Stress: Not at all  Social Connections: Socially Integrated (10/26/2023)   Social Connection and Isolation Panel    Frequency of Communication with Friends and Family: More than three times a week    Frequency of Social Gatherings with Friends and Family: More than three times a week    Attends Religious Services: More than 4 times per year    Active Member of [employer] or Organizations: Yes    Attends Engineer, structural: More than 4 times per year    Marital Status: Married    Tobacco Counseling Ready to quit: Not Answered Counseling given: Not Answered   Clinical Intake:  Pre-visit preparation completed: Yes  Pain : No/denies pain     Diabetes: No  How often do you  need to have someone help you when you read instructions, pamphlets, or other written materials from your doctor or pharmacy?: 1 - Never  Interpreter Needed?: No  Information entered by :: Mliss Graff LPN   Activities of Daily Living    10/26/2023    3:16 PM  In your present state of health, do you have any difficulty performing the following activities:  Hearing? 0  Vision? 0  Difficulty concentrating or making decisions? 0  Walking or climbing stairs? 0  Dressing or bathing? 0  Doing errands, shopping? 0  Preparing Food and eating ? N  Using the Toilet? N  In the  past six months, have you accidently leaked urine? N  Do you have problems with loss of bowel control? N  Managing your Medications? N  Managing your Finances? N  Housekeeping or managing your Housekeeping? N    Patient Care Team: Candise Aleene DEL, MD as PCP - General (Family Medicine) Carolee Sherwood JONETTA DOUGLAS, MD as Consulting Physician (Urology)  Indicate any recent Medical Services you may have received from other than [facility] providers in the past year (date may be approximate).     Assessment:   This is a routine wellness examination for Jawon.  Hearing/Vision screen Hearing Screening - Comments:: No trouble hearing Vision Screening - Comments:: Not up to date   Goals Addressed             This Visit's Progress    Patient Stated       Stay healthy       Depression Screen    10/26/2023    3:19 PM 06/09/2023    9:11 AM 12/10/2022    9:39 AM 10/08/2022    1:20 PM 06/11/2022    7:59 AM 12/09/2021    8:06 AM 06/10/2021    8:05 AM  PHQ 2/9 Scores  PHQ - 2 Score 0 0 0 0 0 0 0  PHQ- 9 Score 0          Fall Risk    10/26/2023    3:15 PM 06/09/2023    9:11 AM 06/06/2023    8:58 AM 12/10/2022    9:38 AM 10/08/2022    1:20 PM  Fall Risk   Falls in the past year? 0 0 0 0 0  Number falls in past yr: 0 0   0  Injury with Fall? 0 0   0  Risk for fall due to :  No Fall Risks  No Fall Risks No Fall Risks  Follow up  Falls evaluation completed;Education provided;Falls prevention discussed Falls evaluation completed  Falls evaluation completed Education provided    MEDICARE RISK AT HOME: Medicare Risk at Home Any stairs in or around the home?: No If so, are there any without handrails?: No Home free of loose throw rugs in walkways, pet beds, electrical cords, etc?: Yes Adequate lighting in your home to reduce risk of falls?: Yes Life alert?: No Use of a cane, walker or w/c?: No Grab bars in the bathroom?: No Shower chair or bench in shower?: Yes Elevated toilet seat or a handicapped toilet?: No  TIMED UP AND GO:  Was the test performed?  No    Cognitive Function:        10/26/2023    3:16 PM 10/08/2022    1:22 PM  6CIT Screen  What Year? 0 points 0 points  What month? 0 points 0 points  What time? 0 points 0 points  Count back from 20 0 points 0 points  Months in reverse 0 points 0 points  Repeat phrase 0 points 0 points  Total Score 0 points 0 points    Immunizations Immunization History  Administered Date(s) Administered   Zoster Recombinant(Shingrix) 12/25/2020, 06/10/2021    TDAP status: Due, Education has been provided regarding the importance of this vaccine. Advised may receive this vaccine at local pharmacy or Health Dept. Aware to provide a copy of the vaccination record if obtained from local pharmacy or Health Dept. Verbalized acceptance and understanding.  Flu Vaccine status: Declined, Education has been provided regarding the importance of this vaccine but patient still declined. Advised may  receive this vaccine at local pharmacy or Health Dept. Aware to provide a copy of the vaccination record if obtained from local pharmacy or Health Dept. Verbalized acceptance and understanding.  Pneumococcal vaccine status: Declined,  Education has been provided regarding the importance of this vaccine but patient still declined. Advised may receive this vaccine at local pharmacy or  Health Dept. Aware to provide a copy of the vaccination record if obtained from local pharmacy or Health Dept. Verbalized acceptance and understanding.   Covid-19 vaccine status: Declined, Education has been provided regarding the importance of this vaccine but patient still declined. Advised may receive this vaccine at local pharmacy or Health Dept.or vaccine clinic. Aware to provide a copy of the vaccination record if obtained from local pharmacy or Health Dept. Verbalized acceptance and understanding.  Qualifies for Shingles Vaccine? No   Zostavax completed Yes   Shingrix Completed?: Yes  Screening Tests Health Maintenance  Topic Date Due   Colonoscopy  Never done   COLON CANCER SCREENING ANNUAL FOBT  10/08/2023   INFLUENZA VACCINE  12/02/2023   Medicare Annual Wellness (AWV)  10/25/2024   Zoster Vaccines- Shingrix  Completed   Hepatitis B Vaccines  Aged Out   HPV VACCINES  Aged Out   Meningococcal B Vaccine  Aged Out   DTaP/Tdap/Td  Discontinued   Pneumococcal Vaccine: 50+ Years  Discontinued   COVID-19 Vaccine  Discontinued   Hepatitis C Screening  Discontinued    Health Maintenance  Health Maintenance Due  Topic Date Due   Colonoscopy  Never done   COLON CANCER SCREENING ANNUAL FOBT  10/08/2023      Lung Cancer Screening: (Low Dose CT Chest recommended if Age 22-80 years, 20 pack-year currently smoking OR have quit w/in 15years.) does not qualify.   Lung Cancer Screening Referral:   Additional Screening:  Hepatitis C Screening  never done  Vision Screening: Recommended annual ophthalmology exams for early detection of glaucoma and other disorders of the eye. Is the patient up to date with their annual eye exam?  No  Who is the provider or what is the name of the office in which the patient attends annual eye exams?  If pt is not established with a provider, would they like to be referred to a provider to establish care? No .   Dental Screening: Recommended annual  dental exams for proper oral hygiene    Community Resource Referral / Chronic Care Management: CRR required this visit?  No   CCM required this visit?  No     Plan:     I have personally reviewed and noted the following in the patient's chart:   Medical and social history Use of alcohol, tobacco or illicit drugs  Current medications and supplements including opioid prescriptions. Patient is currently taking opioid prescriptions. Information provided to patient regarding non-opioid alternatives. Patient advised to discuss non-opioid treatment plan with their provider. Functional ability and status Nutritional status Physical activity Advanced directives List of other physicians Hospitalizations, surgeries, and ER visits in previous 12 months Vitals Screenings to include cognitive, depression, and falls Referrals and appointments  In addition, I have reviewed and discussed with patient certain preventive protocols, quality metrics, and best practice recommendations. A written personalized care plan for preventive services as well as general preventive health recommendations were provided to patient.     Mliss Graff, LPN   [id number]   After Visit Summary: (MyChart) Due to this being a telephonic visit, the after visit summary with patients personalized  plan was offered to patient via MyChart   Nurse Notes:

## 2023-10-26 NOTE — Patient Instructions (Signed)
 [name] , Thank you for taking time to come for your Medicare Wellness Visit. I appreciate your ongoing commitment to your health goals. Please review the following plan we discussed and let me know if I can assist you in the future.   Screening recommendations/referrals: Colonoscopy:  Recommended yearly ophthalmology/optometry visit for glaucoma screening and checkup Recommended yearly dental visit for hygiene and checkup  Vaccinations: Influenza vaccine:  Pneumococcal vaccine:  Tdap vaccine:  Shingles vaccine:        Preventive Care 65 Years and Older, Male Preventive care refers to lifestyle choices and visits with your health care provider that can promote health and wellness. What does preventive care include? A yearly physical exam. This is also called an annual well check. Dental exams once or twice a year. Routine eye exams. Ask your health care provider how often you should have your eyes checked. Personal lifestyle choices, including: Daily care of your teeth and gums. Regular physical activity. Eating a healthy diet. Avoiding tobacco and drug use. Limiting alcohol use. Practicing safe sex. Taking low doses of aspirin  every day. Taking vitamin and mineral supplements as recommended by your health care provider. What happens during an annual well check? The services and screenings done by your health care provider during your annual well check will depend on your age, overall health, lifestyle risk factors, and family history of disease. Counseling  Your health care provider may ask you questions about your: Alcohol use. Tobacco use. Drug use. Emotional well-being. Home and relationship well-being. Sexual activity. Eating habits. History of falls. Memory and ability to understand (cognition). Work and work Astronomer. Screening  You may have the following tests or measurements: Height, weight, and BMI. Blood pressure. Lipid and cholesterol levels. These  may be checked every 5 years, or more frequently if you are over 58 years old. Skin check. Lung cancer screening. You may have this screening every year starting at age 79 if you have a 30-pack-year history of smoking and currently smoke or have quit within the past 15 years. Fecal occult blood test (FOBT) of the stool. You may have this test every year starting at age 76. Flexible sigmoidoscopy or colonoscopy. You may have a sigmoidoscopy every 5 years or a colonoscopy every 10 years starting at age 70. Prostate cancer screening. Recommendations will vary depending on your family history and other risks. Hepatitis C blood test. Hepatitis B blood test. Sexually transmitted disease (STD) testing. Diabetes screening. This is done by checking your blood sugar (glucose) after you have not eaten for a while (fasting). You may have this done every 1-3 years. Abdominal aortic aneurysm (AAA) screening. You may need this if you are a current or former smoker. Osteoporosis. You may be screened starting at age 29 if you are at high risk. Talk with your health care provider about your test results, treatment options, and if necessary, the need for more tests. Vaccines  Your health care provider may recommend certain vaccines, such as: Influenza vaccine. This is recommended every year. Tetanus, diphtheria, and acellular pertussis (Tdap, Td) vaccine. You may need a Td booster every 10 years. Zoster vaccine. You may need this after age 29. Pneumococcal 13-valent conjugate (PCV13) vaccine. One dose is recommended after age 58. Pneumococcal polysaccharide (PPSV23) vaccine. One dose is recommended after age 15. Talk to your health care provider about which screenings and vaccines you need and how often you need them. This information is not intended to replace advice given to you by your health  care provider. Make sure you discuss any questions you have with your health care provider. Document Released:  05/16/2015 Document Revised: 01/07/2016 Document Reviewed: 02/18/2015 Elsevier Interactive Patient Education  2017 [employer].  Fall Prevention in the Home Falls can cause injuries. They can happen to people of all ages. There are many things you can do to make your home safe and to help prevent falls. What can I do on the outside of my home? Regularly fix the edges of walkways and driveways and fix any cracks. Remove anything that might make you trip as you walk through a door, such as a raised step or threshold. Trim any bushes or trees on the path to your home. Use bright outdoor lighting. Clear any walking paths of anything that might make someone trip, such as rocks or tools. Regularly check to see if handrails are loose or broken. Make sure that both sides of any steps have handrails. Any raised decks and porches should have guardrails on the edges. Have any leaves, snow, or ice cleared regularly. Use sand or salt on walking paths during winter. Clean up any spills in your garage right away. This includes oil or grease spills. What can I do in the bathroom? Use night lights. Install grab bars by the toilet and in the tub and shower. Do not use towel bars as grab bars. Use non-skid mats or decals in the tub or shower. If you need to sit down in the shower, use a plastic, non-slip stool. Keep the floor dry. Clean up any water that spills on the floor as soon as it happens. Remove soap buildup in the tub or shower regularly. Attach bath mats securely with double-sided non-slip rug tape. Do not have throw rugs and other things on the floor that can make you trip. What can I do in the bedroom? Use night lights. Make sure that you have a light by your bed that is easy to reach. Do not use any sheets or blankets that are too big for your bed. They should not hang down onto the floor. Have a firm chair that has side arms. You can use this for support while you get dressed. Do not have  throw rugs and other things on the floor that can make you trip. What can I do in the kitchen? Clean up any spills right away. Avoid walking on wet floors. Keep items that you use a lot in easy-to-reach places. If you need to reach something above you, use a strong step stool that has a grab bar. Keep electrical cords out of the way. Do not use floor polish or wax that makes floors slippery. If you must use wax, use non-skid floor wax. Do not have throw rugs and other things on the floor that can make you trip. What can I do with my stairs? Do not leave any items on the stairs. Make sure that there are handrails on both sides of the stairs and use them. Fix handrails that are broken or loose. Make sure that handrails are as long as the stairways. Check any carpeting to make sure that it is firmly attached to the stairs. Fix any carpet that is loose or worn. Avoid having throw rugs at the top or bottom of the stairs. If you do have throw rugs, attach them to the floor with carpet tape. Make sure that you have a light switch at the top of the stairs and the bottom of the stairs. If you do not  have them, ask someone to add them for you. What else can I do to help prevent falls? Wear shoes that: Do not have high heels. Have rubber bottoms. Are comfortable and fit you well. Are closed at the toe. Do not wear sandals. If you use a stepladder: Make sure that it is fully opened. Do not climb a closed stepladder. Make sure that both sides of the stepladder are locked into place. Ask someone to hold it for you, if possible. Clearly mark and make sure that you can see: Any grab bars or handrails. First and last steps. Where the edge of each step is. Use tools that help you move around (mobility aids) if they are needed. These include: Canes. Walkers. Scooters. Crutches. Turn on the lights when you go into a dark area. Replace any light bulbs as soon as they burn out. Set up your furniture so  you have a clear path. Avoid moving your furniture around. If any of your floors are uneven, fix them. If there are any pets around you, be aware of where they are. Review your medicines with your doctor. Some medicines can make you feel dizzy. This can increase your chance of falling. Ask your doctor what other things that you can do to help prevent falls. This information is not intended to replace advice given to you by your health care provider. Make sure you discuss any questions you have with your health care provider. Document Released: 02/13/2009 Document Revised: 09/25/2015 Document Reviewed: 05/24/2014 Elsevier Interactive Patient Education  2017 [employer].

## 2023-12-02 ENCOUNTER — Encounter: Payer: Self-pay | Admitting: Family Medicine

## 2023-12-07 ENCOUNTER — Ambulatory Visit (INDEPENDENT_AMBULATORY_CARE_PROVIDER_SITE_OTHER): Payer: Medicare Other | Admitting: Family Medicine

## 2023-12-07 ENCOUNTER — Encounter: Payer: Self-pay | Admitting: Family Medicine

## 2023-12-07 VITALS — BP 132/71 | HR 57 | Temp 98.3°F | Ht 70.0 in | Wt 204.8 lb

## 2023-12-07 DIAGNOSIS — Z125 Encounter for screening for malignant neoplasm of prostate: Secondary | ICD-10-CM

## 2023-12-07 DIAGNOSIS — Z1211 Encounter for screening for malignant neoplasm of colon: Secondary | ICD-10-CM | POA: Diagnosis not present

## 2023-12-07 DIAGNOSIS — K4090 Unilateral inguinal hernia, without obstruction or gangrene, not specified as recurrent: Secondary | ICD-10-CM | POA: Diagnosis not present

## 2023-12-07 DIAGNOSIS — E7800 Pure hypercholesterolemia, unspecified: Secondary | ICD-10-CM

## 2023-12-07 DIAGNOSIS — Z0000 Encounter for general adult medical examination without abnormal findings: Secondary | ICD-10-CM

## 2023-12-07 DIAGNOSIS — I10 Essential (primary) hypertension: Secondary | ICD-10-CM | POA: Diagnosis not present

## 2023-12-07 DIAGNOSIS — N200 Calculus of kidney: Secondary | ICD-10-CM

## 2023-12-07 DIAGNOSIS — R7303 Prediabetes: Secondary | ICD-10-CM | POA: Diagnosis not present

## 2023-12-07 LAB — CBC WITH DIFFERENTIAL/PLATELET
Basophils Absolute: 0.1 K/uL (ref 0.0–0.1)
Basophils Relative: 1.1 % (ref 0.0–3.0)
Eosinophils Absolute: 0.2 K/uL (ref 0.0–0.7)
Eosinophils Relative: 3.7 % (ref 0.0–5.0)
HCT: 45.1 % (ref 39.0–52.0)
Hemoglobin: 15.2 g/dL (ref 13.0–17.0)
Lymphocytes Relative: 28.5 % (ref 12.0–46.0)
Lymphs Abs: 1.7 K/uL (ref 0.7–4.0)
MCHC: 33.7 g/dL (ref 30.0–36.0)
MCV: 92 fl (ref 78.0–100.0)
Monocytes Absolute: 0.7 K/uL (ref 0.1–1.0)
Monocytes Relative: 12.3 % — ABNORMAL HIGH (ref 3.0–12.0)
Neutro Abs: 3.3 K/uL (ref 1.4–7.7)
Neutrophils Relative %: 54.4 % (ref 43.0–77.0)
Platelets: 148 K/uL — ABNORMAL LOW (ref 150.0–400.0)
RBC: 4.9 Mil/uL (ref 4.22–5.81)
RDW: 12.7 % (ref 11.5–15.5)
WBC: 6 K/uL (ref 4.0–10.5)

## 2023-12-07 LAB — COMPREHENSIVE METABOLIC PANEL WITH GFR
ALT: 15 U/L (ref 0–53)
AST: 13 U/L (ref 0–37)
Albumin: 4.4 g/dL (ref 3.5–5.2)
Alkaline Phosphatase: 62 U/L (ref 39–117)
BUN: 16 mg/dL (ref 6–23)
CO2: 31 meq/L (ref 19–32)
Calcium: 9.9 mg/dL (ref 8.4–10.5)
Chloride: 105 meq/L (ref 96–112)
Creatinine, Ser: 1.1 mg/dL (ref 0.40–1.50)
GFR: 69.85 mL/min (ref >60.00)
Glucose, Bld: 97 mg/dL (ref 70–99)
Potassium: 4.5 meq/L (ref 3.5–5.1)
Sodium: 142 meq/L (ref 135–145)
Total Bilirubin: 0.7 mg/dL (ref 0.2–1.2)
Total Protein: 7 g/dL (ref 6.0–8.3)

## 2023-12-07 LAB — HEMOGLOBIN A1C: Hgb A1c MFr Bld: 5.9 % (ref 4.6–6.5)

## 2023-12-07 LAB — LIPID PANEL
Cholesterol: 173 mg/dL (ref 0–200)
HDL: 58.7 mg/dL (ref >39.00)
LDL Cholesterol: 96 mg/dL (ref 0–99)
NonHDL: 113.99
Total CHOL/HDL Ratio: 3
Triglycerides: 90 mg/dL (ref 0.0–149.0)
VLDL: 18 mg/dL (ref 0.0–40.0)

## 2023-12-07 LAB — PSA, MEDICARE: PSA: 1.99 ng/mL (ref 0.10–4.00)

## 2023-12-07 MED ORDER — HYDROCODONE-ACETAMINOPHEN 5-325 MG PO TABS: ORAL_TABLET | ORAL | 0 refills | Status: AC

## 2023-12-07 MED ORDER — TAMSULOSIN HCL 0.4 MG PO CAPS: 0.4000 mg | ORAL_CAPSULE | Freq: Every day | ORAL | 3 refills | Status: AC

## 2023-12-07 NOTE — Progress Notes (Signed)
 Office Note 12/07/2023  CC:  Chief Complaint  Patient presents with   Annual Exam    Pt is fasting    HPI:  Patient is a 68 y.o. male who is here for annual health maintenance exam and 20-month follow-up hypertension. A/P as of last visit: 1 hypertension, well-controlled on amlodipine  10 mg a day, irbesartan  300 mg a day, and Toprol -XL 50 mg a day. Electrolytes and creatinine today.   #2 prediabetes. Doing great with diet. POC Hba1c 6 months ago was down to 5.4%. Checking fasting glucose today.   #3 hypercholesterolemia.   2023 Framingham risk is 12%, recommended statin, pt declined  LDL was improved to 91 6 months ago. Will check lipid panel annually (next in 6 mo).  INTERIM HX: Tim feels well.  He is enjoying retirement.  Unfortunately he has had 4 kidney stones since February this year.  He seems to pass these without a lot of difficulty, some waxing and waning left-sided back pain for the most part, then he notices a stone come out, occasionally has some blood. He has used tamsulosin  as needed in the past, a couple of years ago, felt like it did not help much but wants to retry it. He does occasionally use Vicodin.  He has had a left inguinal hernia for many years.  It has never really bothered him other than feeling cognizant of it when he strains.  He would like a referral to general surgery.   PMP AWARE reviewed today: most recent rx for Vicodin 5/325 was filled 06/09/23, # 20, rx by me. No red flags.   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 Leigh    History reviewed. No pertinent family history.  Social History   Socioeconomic History   Marital status: Married    Spouse name: Not on file   Number of children: Not on file   Years of education: Not on file   Highest education level: Bachelor's degree (e.g., BA, AB, BS)  Occupational History   Not on file  Tobacco Use   Smoking status: Never   Smokeless tobacco: Current    Types: Snuff  Vaping Use   Vaping status: Never Used  Substance and Sexual Activity   Alcohol use: No   Drug use: No   Sexual activity: Not on file  Other Topics Concern   Not on file  Social History Narrative   Married, one 21 yr old son.   Lives in [place], [place].  Worked as Naval architect, Designer, multimedia.  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   Never smoker, no ETOH, no drugs.      Social Drivers of Corporate investment banker Strain: Low Risk  (10/26/2023)   Overall Financial Resource Strain (CARDIA)    Difficulty of Paying Living Expenses: Not hard at all  Food Insecurity: No Food Insecurity (10/26/2023)   Hunger Vital Sign    Worried About Running Out of Food in the Last Year: Never true    Ran Out of Food in the Last Year: Never true  Transportation Needs: No Transportation Needs (10/26/2023)   PRAPARE - Therapist, art (Medical): No    Lack of Transportation (Non-Medical): No  Physical Activity: Inactive (10/26/2023)   Exercise Vital Sign    Days of Exercise per Week: 0 days    Minutes of Exercise per Session: 0 min  Stress: No Stress Concern Present (10/26/2023)   [employer] of Occupational Health - Occupational Stress Questionnaire    Feeling of Stress: Not at all  Social Connections: Socially Integrated (10/26/2023)   Social Connection and Isolation Panel    Frequency of Communication with Friends and Family: More than three times a week    Frequency of Social Gatherings with Friends and Family: More than three times a week    Attends Religious Services: More than 4 times per year    Active Member of [employer] or Organizations: Yes    Attends Engineer, structural: More than 4 times per year    Marital Status: Married  Catering manager Violence: Not At Risk (10/26/2023)   Humiliation, Afraid, Rape, and Kick questionnaire    Fear of Current or Ex-Partner: No    Emotionally Abused: No    Physically Abused: No    Sexually Abused: No    Outpatient Medications Prior to Visit  Medication Sig Dispense Refill   amLODipine  (NORVASC ) 10 MG tablet TAKE ONE (1) TABLET BY MOUTH EVERY DAY 90 tablet 3   aspirin  EC 81 MG tablet Take 81 mg by mouth daily. Swallow whole.     ibuprofen  (ADVIL ) 800 MG tablet TAKE ONE TABLET (800MG  TOTAL) BY MOUTH EVERY 8 HOURS AS NEEDED 60 tablet 3   irbesartan  (AVAPRO ) 300 MG tablet TAKE ONE (1) TABLET BY MOUTH EVERY DAY 90 tablet 3   metoprolol  succinate (TOPROL -XL) 50 MG 24 hr tablet TAKE 1 TABLET BY MOUTH DAILY WITH OR IMMEDIATELY FOLLOWING A MEAL 90 tablet 3   Multiple Vitamin (MULTIVITAMIN) tablet Take 1 tablet by mouth daily.     HYDROcodone -acetaminophen  (NORCO/VICODIN) 5-325 MG tablet TAKE ONE TO TWO TABLETS BY MOUTH EVERY SIX HOURS AS NEEDED 20 tablet 0   No facility-administered medications prior to visit.    No Known  Allergies  Review of Systems  Constitutional:  Negative for appetite change, chills, fatigue and fever.  HENT:  Negative for congestion, dental problem, ear pain and sore throat.   Eyes:  Negative for discharge, redness and visual disturbance.  Respiratory:  Negative for cough, chest tightness, shortness of breath and wheezing.   Cardiovascular:  Negative for chest pain, palpitations and leg swelling.  Gastrointestinal:  Negative for abdominal pain, blood in stool, diarrhea, nausea and  vomiting.  Genitourinary:  Negative for difficulty urinating, dysuria, flank pain, frequency, hematuria and urgency.  Musculoskeletal:  Negative for arthralgias, back pain, joint swelling, myalgias and neck stiffness.  Skin:  Negative for pallor and rash.  Neurological:  Negative for dizziness, speech difficulty, weakness and headaches.  Hematological:  Negative for adenopathy. Does not bruise/bleed easily.  Psychiatric/Behavioral:  Negative for confusion and sleep disturbance. The patient is not nervous/anxious.     PE;    12/07/2023    8:57 AM 10/26/2023    3:18 PM 06/09/2023    9:19 AM  Vitals with BMI  Height 5' 10 5' 10   Weight 204 lbs 13 oz 170 lbs   BMI 29.39 24.39   Systolic 132  138  Diastolic 71  80  Pulse 57      Gen: Alert, well appearing.  Patient is oriented to person, place, time, and situation. AFFECT: pleasant, lucid thought and speech. ENT: Ears: EACs clear, normal epithelium.  TMs with good light reflex and landmarks bilaterally.  Eyes: no injection, icteris, swelling, or exudate.  EOMI, PERRLA. Nose: no drainage or turbinate edema/swelling.  No injection or focal lesion.  Mouth: lips without lesion/swelling.  Oral mucosa pink and moist.  Dentition intact and without obvious caries or gingival swelling.  Oropharynx without erythema, exudate, or swelling.  Neck: supple/nontender.  No LAD, mass, or TM.  Carotid pulses 2+ bilaterally, without bruits. CV: RRR, no m/r/g.   LUNGS: CTA  bilat, nonlabored resps, good aeration in all lung fields. ABD: soft, NT, ND, BS normal.  No hepatospenomegaly or mass.  No bruits. Groin: Left inguinal bulge, nontender.  This does retract when he lies supine.  Also reduces when manual pressure applied. EXT: no clubbing, cyanosis, or edema.  Musculoskeletal: no joint swelling, erythema, warmth, or tenderness.  ROM of all joints intact. Skin - no sores or suspicious lesions or rashes or color changes  Pertinent labs:  Lab Results  Component Value Date   TSH 1.03 01/01/2020   Lab Results  Component Value Date   WBC 5.8 12/10/2022   HGB 14.5 12/10/2022   HCT 44.5 12/10/2022   MCV 92.9 12/10/2022   PLT 134.0 (L) 12/10/2022   Lab Results  Component Value Date   CREATININE 1.03 06/09/2023   BUN 17 06/09/2023   NA 142 06/09/2023   K 4.4 06/09/2023   CL 107 06/09/2023   CO2 29 06/09/2023   Lab Results  Component Value Date   ALT 13 12/10/2022   AST 15 12/10/2022   ALKPHOS 66 12/10/2022   BILITOT 0.7 12/10/2022   Lab Results  Component Value Date   CHOL 162 12/10/2022   Lab Results  Component Value Date   HDL 49.10 12/10/2022   Lab Results  Component Value Date   LDLCALC 91 12/10/2022   Lab Results  Component Value Date   TRIG 111.0 12/10/2022   Lab Results  Component Value Date   CHOLHDL 3 12/10/2022   Lab Results  Component Value Date   PSA 1.93 12/10/2022   PSA 1.56 12/09/2021   PSA 1.38 12/25/2020   Lab Results  Component Value Date   HGBA1C 5.4 12/10/2022   HGBA1C 5.4 12/10/2022   HGBA1C 5.4 (A) 12/10/2022   HGBA1C 5.4 12/10/2022   ASSESSMENT AND PLAN:   1 health maintenance exam: Reviewed age and gender appropriate health maintenance issues (prudent diet, regular exercise, health risks of tobacco and excessive alcohol, use of seatbelts, fire alarms in home, use of sunscreen).  Also reviewed age and gender appropriate health screening as well as vaccine recommendations. Vaccines: All UTD. Labs:  CBC, c-Met, FLP, hemoglobin A1c, PSA. Prostate ca screening: PSA Colon ca screening: iFOB neg 10/2022.  Cologuard ordered today.  #2 hypertension, well-controlled on amlodipine  10 mg a day, irbesartan  300 mg a day, and Toprol -XL 50 mg a day. Electrolytes and creatinine monitoring today.  3.  Recurrent kidney stones. He has a urologist. Currently asymptomatic. I do provide a prescription for Vicodin for him to use as needed stone. I did refill for #20 of these today. I also got him started on Flomax  0.4 mg to use as needed.  4.  Left inguinal hernia. Not enlarging.  No pain.  He wants to talk with the surgeon about elective repair. Referral ordered today.  An After Visit Summary was printed and given to the patient.  FOLLOW UP:  Return in about 6 months (around 06/08/2024) for routine chronic illness f/u.  Signed:  Gerlene Hockey, MD           12/07/2023

## 2023-12-07 NOTE — Patient Instructions (Signed)
 Health Maintenance, Male  Adopting a healthy lifestyle and getting preventive care are important in promoting health and wellness. Ask your health care provider about:  The right schedule for you to have regular tests and exams.  Things you can do on your own to prevent diseases and keep yourself healthy.  What should I know about diet, weight, and exercise?  Eat a healthy diet    Eat a diet that includes plenty of vegetables, fruits, low-fat dairy products, and lean protein.  Do not eat a lot of foods that are high in solid fats, added sugars, or sodium.  Maintain a healthy weight  Body mass index (BMI) is a measurement that can be used to identify possible weight problems. It estimates body fat based on height and weight. Your health care provider can help determine your BMI and help you achieve or maintain a healthy weight.  Get regular exercise  Get regular exercise. This is one of the most important things you can do for your health. Most adults should:  Exercise for at least 150 minutes each week. The exercise should increase your heart rate and make you sweat (moderate-intensity exercise).  Do strengthening exercises at least twice a week. This is in addition to the moderate-intensity exercise.  Spend less time sitting. Even light physical activity can be beneficial.  Watch cholesterol and blood lipids  Have your blood tested for lipids and cholesterol at 67 years of age, then have this test every 5 years.  You may need to have your cholesterol levels checked more often if:  Your lipid or cholesterol levels are high.  You are older than 67 years of age.  You are at high risk for heart disease.  What should I know about cancer screening?  Many types of cancers can be detected early and may often be prevented. Depending on your health history and family history, you may need to have cancer screening at various ages. This may include screening for:  Colorectal cancer.  Prostate cancer.  Skin cancer.  Lung  cancer.  What should I know about heart disease, diabetes, and high blood pressure?  Blood pressure and heart disease  High blood pressure causes heart disease and increases the risk of stroke. This is more likely to develop in people who have high blood pressure readings or are overweight.  Talk with your health care provider about your target blood pressure readings.  Have your blood pressure checked:  Every 3-5 years if you are 9-95 years of age.  Every year if you are 85 years old or older.  If you are between the ages of 29 and 29 and are a current or former smoker, ask your health care provider if you should have a one-time screening for abdominal aortic aneurysm (AAA).  Diabetes  Have regular diabetes screenings. This checks your fasting blood sugar level. Have the screening done:  Once every three years after age 23 if you are at a normal weight and have a low risk for diabetes.  More often and at a younger age if you are overweight or have a high risk for diabetes.  What should I know about preventing infection?  Hepatitis B  If you have a higher risk for hepatitis B, you should be screened for this virus. Talk with your health care provider to find out if you are at risk for hepatitis B infection.  Hepatitis C  Blood testing is recommended for:  Everyone born from [birth year] through 1964.  Anyone  with known risk factors for hepatitis C.  Sexually transmitted infections (STIs)  You should be screened each year for STIs, including gonorrhea and chlamydia, if:  You are sexually active and are younger than 67 years of age.  You are older than 67 years of age and your health care provider tells you that you are at risk for this type of infection.  Your sexual activity has changed since you were last screened, and you are at increased risk for chlamydia or gonorrhea. Ask your health care provider if you are at risk.  Ask your health care provider about whether you are at high risk for HIV. Your health care provider  may recommend a prescription medicine to help prevent HIV infection. If you choose to take medicine to prevent HIV, you should first get tested for HIV. You should then be tested every 3 months for as long as you are taking the medicine.  Follow these instructions at home:  Alcohol use  Do not drink alcohol if your health care provider tells you not to drink.  If you drink alcohol:  Limit how much you have to 0-2 drinks a day.  Know how much alcohol is in your drink. In the [place] one drink equals one 12 oz bottle of beer (355 mL), one 5 oz glass of wine (148 mL), or one 1 oz glass of hard liquor (44 mL).  Lifestyle  Do not use any products that contain nicotine or tobacco. These products include cigarettes, chewing tobacco, and vaping devices, such as e-cigarettes. If you need help quitting, ask your health care provider.  Do not use street drugs.  Do not share needles.  Ask your health care provider for help if you need support or information about quitting drugs.  General instructions  Schedule regular health, dental, and eye exams.  Stay current with your vaccines.  Tell your health care provider if:  You often feel depressed.  You have ever been abused or do not feel safe at home.  Summary  Adopting a healthy lifestyle and getting preventive care are important in promoting health and wellness.  Follow your health care provider's instructions about healthy diet, exercising, and getting tested or screened for diseases.  Follow your health care provider's instructions on monitoring your cholesterol and blood pressure.  This information is not intended to replace advice given to you by your health care provider. Make sure you discuss any questions you have with your health care provider.  Document Revised: 09/08/2020 Document Reviewed: 09/08/2020  Elsevier Patient Education  2024 [employer].

## 2023-12-08 ENCOUNTER — Ambulatory Visit: Payer: Self-pay | Admitting: Family Medicine

## 2023-12-14 DIAGNOSIS — G8929 Other chronic pain: Secondary | ICD-10-CM | POA: Diagnosis not present

## 2023-12-15 ENCOUNTER — Ambulatory Visit: Payer: Self-pay | Admitting: General Surgery

## 2023-12-15 DIAGNOSIS — K4090 Unilateral inguinal hernia, without obstruction or gangrene, not specified as recurrent: Secondary | ICD-10-CM | POA: Diagnosis not present

## 2023-12-27 DIAGNOSIS — Z1211 Encounter for screening for malignant neoplasm of colon: Secondary | ICD-10-CM | POA: Diagnosis not present

## 2024-01-03 ENCOUNTER — Encounter: Payer: Self-pay | Admitting: Family Medicine

## 2024-01-03 LAB — COLOGUARD: COLOGUARD: NEGATIVE

## 2024-01-05 ENCOUNTER — Encounter (HOSPITAL_BASED_OUTPATIENT_CLINIC_OR_DEPARTMENT_OTHER)
Admission: RE | Admit: 2024-01-05 | Discharge: 2024-01-05 | Disposition: A | Discharge status: Home / Self Care | Source: Ambulatory Visit | Attending: General Surgery | Admitting: General Surgery

## 2024-01-05 DIAGNOSIS — K4090 Unilateral inguinal hernia, without obstruction or gangrene, not specified as recurrent: Secondary | ICD-10-CM | POA: Diagnosis not present

## 2024-01-05 DIAGNOSIS — Z01810 Encounter for preprocedural cardiovascular examination: Secondary | ICD-10-CM | POA: Insufficient documentation

## 2024-01-05 DIAGNOSIS — Z01818 Encounter for other preprocedural examination: Secondary | ICD-10-CM | POA: Diagnosis not present

## 2024-01-05 DIAGNOSIS — I10 Essential (primary) hypertension: Secondary | ICD-10-CM | POA: Diagnosis not present

## 2024-01-05 DIAGNOSIS — R9431 Abnormal electrocardiogram [ECG] [EKG]: Secondary | ICD-10-CM | POA: Diagnosis not present

## 2024-01-05 DIAGNOSIS — Z79899 Other long term (current) drug therapy: Secondary | ICD-10-CM | POA: Diagnosis not present

## 2024-01-05 MED ORDER — CHLORHEXIDINE GLUCONATE CLOTH 2 % EX PADS: 6.0000 | MEDICATED_PAD | Freq: Once | CUTANEOUS | Status: DC

## 2024-01-05 NOTE — Anesthesia Preprocedure Evaluation (Signed)
 Anesthesia Evaluation  Patient identified by MRN, date of birth, ID band Patient awake    Reviewed: Allergy & Precautions, NPO status , Patient's Chart, lab work & pertinent test results  Airway Mallampati: II  TM Distance: >3 FB Neck ROM: Full    Dental  (+) Dental Advisory Given, Chipped,    Pulmonary neg pulmonary ROS   Pulmonary exam normal breath sounds clear to auscultation       Cardiovascular hypertension, Pt. on medications Normal cardiovascular exam Rhythm:Regular Rate:Normal     Neuro/Psych negative neurological ROS     GI/Hepatic negative GI ROS, Neg liver ROS,,,  Endo/Other  negative endocrine ROS    Renal/GU Renal disease     Musculoskeletal negative musculoskeletal ROS (+)    Abdominal   Peds  Hematology negative hematology ROS (+)   Anesthesia Other Findings   Reproductive/Obstetrics                              Anesthesia Physical Anesthesia Plan  ASA: 3  Anesthesia Plan: General   Post-op Pain Management: Regional block*, Tylenol  PO (pre-op)* and Gabapentin  PO (pre-op)*   Induction: Intravenous  PONV Risk Score and Plan: 4 or greater and Ondansetron , Dexamethasone , Treatment may vary due to age or medical condition and Midazolam   Airway Management Planned: Oral ETT  Additional Equipment:   Intra-op Plan:   Post-operative Plan: Extubation in OR  Informed Consent: I have reviewed the patients History and Physical, chart, labs and discussed the procedure including the risks, benefits and alternatives for the proposed anesthesia with the patient or authorized representative who has indicated his/her understanding and acceptance.     Dental advisory given  Plan Discussed with: CRNA  Anesthesia Plan Comments:          Anesthesia Quick Evaluation

## 2024-01-05 NOTE — Progress Notes (Signed)

## 2024-01-06 ENCOUNTER — Other Ambulatory Visit: Payer: Self-pay

## 2024-01-06 ENCOUNTER — Encounter (HOSPITAL_BASED_OUTPATIENT_CLINIC_OR_DEPARTMENT_OTHER): Payer: Self-pay | Admitting: General Surgery

## 2024-01-06 ENCOUNTER — Ambulatory Visit (HOSPITAL_BASED_OUTPATIENT_CLINIC_OR_DEPARTMENT_OTHER)
Admission: RE | Admit: 2024-01-06 | Discharge: 2024-01-06 | Disposition: A | Discharge status: Home / Self Care | Attending: General Surgery | Admitting: General Surgery

## 2024-01-06 ENCOUNTER — Ambulatory Visit (HOSPITAL_BASED_OUTPATIENT_CLINIC_OR_DEPARTMENT_OTHER): Payer: Self-pay | Admitting: Anesthesiology

## 2024-01-06 ENCOUNTER — Encounter (HOSPITAL_BASED_OUTPATIENT_CLINIC_OR_DEPARTMENT_OTHER)
Admission: RE | Disposition: A | Discharge status: Home / Self Care | Payer: Self-pay | Source: Home / Self Care | Attending: General Surgery

## 2024-01-06 DIAGNOSIS — Z79899 Other long term (current) drug therapy: Secondary | ICD-10-CM | POA: Insufficient documentation

## 2024-01-06 DIAGNOSIS — K4090 Unilateral inguinal hernia, without obstruction or gangrene, not specified as recurrent: Secondary | ICD-10-CM | POA: Insufficient documentation

## 2024-01-06 DIAGNOSIS — I10 Essential (primary) hypertension: Secondary | ICD-10-CM | POA: Insufficient documentation

## 2024-01-06 DIAGNOSIS — G8918 Other acute postprocedural pain: Secondary | ICD-10-CM | POA: Diagnosis not present

## 2024-01-06 DIAGNOSIS — Z01818 Encounter for other preprocedural examination: Secondary | ICD-10-CM

## 2024-01-06 HISTORY — PX: INGUINAL HERNIA REPAIR: SHX194

## 2024-01-06 SURGERY — REPAIR, HERNIA, INGUINAL, ADULT
Anesthesia: General | Site: Inguinal | Laterality: Left

## 2024-01-06 MED ORDER — CEFAZOLIN SODIUM-DEXTROSE 2-4 GM/100ML-% IV SOLN
2.0000 g | INTRAVENOUS | Status: AC
  Administered 2024-01-06: 2 g via INTRAVENOUS

## 2024-01-06 MED ORDER — OXYCODONE HCL 5 MG PO TABS
5.0000 mg | ORAL_TABLET | Freq: Once | ORAL | Status: AC | PRN
  Administered 2024-01-06: 5 mg via ORAL

## 2024-01-06 MED ORDER — GABAPENTIN 300 MG PO CAPS: 300.0000 mg | ORAL_CAPSULE | Freq: Once | ORAL | Status: DC

## 2024-01-06 MED ORDER — FENTANYL CITRATE (PF) 100 MCG/2ML IJ SOLN
INTRAMUSCULAR | Status: AC
  Filled 2024-01-06: qty 2

## 2024-01-06 MED ORDER — BUPIVACAINE-EPINEPHRINE 0.25% -1:200000 IJ SOLN
INTRAMUSCULAR | Status: DC | PRN
  Administered 2024-01-06: 20 mL

## 2024-01-06 MED ORDER — GABAPENTIN 100 MG PO CAPS
ORAL_CAPSULE | ORAL | Status: AC
  Filled 2024-01-06: qty 1

## 2024-01-06 MED ORDER — DROPERIDOL 2.5 MG/ML IJ SOLN: 0.6250 mg | Freq: Once | INTRAMUSCULAR | Status: DC | PRN

## 2024-01-06 MED ORDER — MIDAZOLAM HCL 2 MG/2ML IJ SOLN
INTRAMUSCULAR | Status: AC
  Filled 2024-01-06: qty 2

## 2024-01-06 MED ORDER — DEXAMETHASONE SODIUM PHOSPHATE 4 MG/ML IJ SOLN
INTRAMUSCULAR | Status: DC | PRN
  Administered 2024-01-06: 5 mg via PERINEURAL

## 2024-01-06 MED ORDER — OXYCODONE HCL 5 MG/5ML PO SOLN: 5.0000 mg | Freq: Once | ORAL | Status: AC | PRN

## 2024-01-06 MED ORDER — MIDAZOLAM HCL 2 MG/2ML IJ SOLN
2.0000 mg | Freq: Once | INTRAMUSCULAR | Status: AC
  Administered 2024-01-06: 1 mg via INTRAVENOUS

## 2024-01-06 MED ORDER — ONDANSETRON HCL 4 MG/2ML IJ SOLN
INTRAMUSCULAR | Status: DC | PRN
  Administered 2024-01-06: 4 mg via INTRAVENOUS

## 2024-01-06 MED ORDER — OXYCODONE HCL 5 MG PO TABS: 5.0000 mg | ORAL_TABLET | Freq: Four times a day (QID) | ORAL | 0 refills | Status: AC | PRN

## 2024-01-06 MED ORDER — FENTANYL CITRATE (PF) 100 MCG/2ML IJ SOLN: 25.0000 ug | INTRAMUSCULAR | Status: DC | PRN

## 2024-01-06 MED ORDER — LACTATED RINGERS IV SOLN: INTRAVENOUS | Status: DC

## 2024-01-06 MED ORDER — LIDOCAINE HCL (CARDIAC) PF 100 MG/5ML IV SOSY
PREFILLED_SYRINGE | INTRAVENOUS | Status: DC | PRN
  Administered 2024-01-06: 80 mg via INTRAVENOUS

## 2024-01-06 MED ORDER — EPHEDRINE SULFATE (PRESSORS) 50 MG/ML IJ SOLN
INTRAMUSCULAR | Status: DC | PRN
Start: 2024-01-06 — End: 2024-01-06
  Administered 2024-01-06 (×2): 10 mg via INTRAVENOUS

## 2024-01-06 MED ORDER — GABAPENTIN 100 MG PO CAPS
100.0000 mg | ORAL_CAPSULE | ORAL | Status: AC
  Administered 2024-01-06: 100 mg via ORAL

## 2024-01-06 MED ORDER — OXYCODONE HCL 5 MG PO TABS
ORAL_TABLET | ORAL | Status: AC
  Filled 2024-01-06: qty 1

## 2024-01-06 MED ORDER — CLONIDINE HCL (ANALGESIA) 100 MCG/ML EP SOLN
EPIDURAL | Status: DC | PRN
Start: 2024-01-06 — End: 2024-01-06
  Administered 2024-01-06: 80 ug

## 2024-01-06 MED ORDER — SUGAMMADEX SODIUM 200 MG/2ML IV SOLN
INTRAVENOUS | Status: DC | PRN
  Administered 2024-01-06: 372.4 mg via INTRAVENOUS

## 2024-01-06 MED ORDER — PROPOFOL 10 MG/ML IV BOLUS
INTRAVENOUS | Status: DC | PRN
  Administered 2024-01-06: 160 mg via INTRAVENOUS

## 2024-01-06 MED ORDER — ROCURONIUM BROMIDE 100 MG/10ML IV SOLN
INTRAVENOUS | Status: DC | PRN
  Administered 2024-01-06: 50 mg via INTRAVENOUS

## 2024-01-06 MED ORDER — ACETAMINOPHEN 500 MG PO TABS: 1000.0000 mg | ORAL_TABLET | Freq: Once | ORAL | Status: DC

## 2024-01-06 MED ORDER — ACETAMINOPHEN 500 MG PO TABS
1000.0000 mg | ORAL_TABLET | ORAL | Status: AC
  Administered 2024-01-06: 1000 mg via ORAL

## 2024-01-06 MED ORDER — DEXAMETHASONE SODIUM PHOSPHATE 4 MG/ML IJ SOLN
INTRAMUSCULAR | Status: DC | PRN
  Administered 2024-01-06: 5 mg via INTRAVENOUS

## 2024-01-06 MED ORDER — PROPOFOL 10 MG/ML IV BOLUS
INTRAVENOUS | Status: AC
  Filled 2024-01-06: qty 20

## 2024-01-06 MED ORDER — ROPIVACAINE HCL 5 MG/ML IJ SOLN
INTRAMUSCULAR | Status: DC | PRN
  Administered 2024-01-06: 30 mL via PERINEURAL

## 2024-01-06 MED ORDER — 0.9 % SODIUM CHLORIDE (POUR BTL) OPTIME
TOPICAL | Status: DC | PRN
  Administered 2024-01-06: 1000 mL

## 2024-01-06 MED ORDER — GABAPENTIN 300 MG PO CAPS
ORAL_CAPSULE | ORAL | Status: AC
  Filled 2024-01-06: qty 1

## 2024-01-06 MED ORDER — CEFAZOLIN SODIUM-DEXTROSE 2-4 GM/100ML-% IV SOLN
INTRAVENOUS | Status: AC
  Filled 2024-01-06: qty 100

## 2024-01-06 MED ORDER — FENTANYL CITRATE (PF) 100 MCG/2ML IJ SOLN
100.0000 ug | Freq: Once | INTRAMUSCULAR | Status: AC
  Administered 2024-01-06: 50 ug via INTRAVENOUS

## 2024-01-06 MED ORDER — ACETAMINOPHEN 500 MG PO TABS
ORAL_TABLET | ORAL | Status: AC
  Filled 2024-01-06: qty 2

## 2024-01-06 SURGICAL SUPPLY — 36 items
BENZOIN TINCTURE PRP APPL 2/3 (GAUZE/BANDAGES/DRESSINGS) IMPLANT
BLADE CLIPPER SURG (BLADE) IMPLANT
BLADE HEX COATED 2.75 (ELECTRODE) ×1 IMPLANT
BLADE SURG 15 STRL LF DISP TIS (BLADE) ×1 IMPLANT
CHLORAPREP W/TINT 26 (MISCELLANEOUS) ×1 IMPLANT
COVER BACK TABLE 60X90IN (DRAPES) ×1 IMPLANT
COVER MAYO STAND STRL (DRAPES) ×1 IMPLANT
DERMABOND ADVANCED .7 DNX12 (GAUZE/BANDAGES/DRESSINGS) ×1 IMPLANT
DRAIN PENROSE .5X12 LATEX STL (DRAIN) ×1 IMPLANT
DRAPE LAPAROTOMY TRNSV 102X78 (DRAPES) ×1 IMPLANT
DRAPE UTILITY XL STRL (DRAPES) ×1 IMPLANT
ELECTRODE REM PT RTRN 9FT ADLT (ELECTROSURGICAL) ×1 IMPLANT
GLOVE BIO SURGEON STRL SZ7.5 (GLOVE) ×1 IMPLANT
GOWN STRL REUS W/ TWL LRG LVL3 (GOWN DISPOSABLE) ×2 IMPLANT
MESH ULTRAPRO 3X6 7.6X15CM (Mesh General) IMPLANT
NDL HYPO 25X1 1.5 SAFETY (NEEDLE) ×1 IMPLANT
NEEDLE HYPO 25X1 1.5 SAFETY (NEEDLE) ×1 IMPLANT
NS IRRIG 1000ML POUR BTL (IV SOLUTION) ×1 IMPLANT
PACK BASIN DAY SURGERY FS (CUSTOM PROCEDURE TRAY) ×1 IMPLANT
PENCIL SMOKE EVACUATOR (MISCELLANEOUS) ×1 IMPLANT
SLEEVE SCD COMPRESS KNEE MED (STOCKING) ×1 IMPLANT
SPIKE FLUID TRANSFER (MISCELLANEOUS) IMPLANT
SPONGE T-LAP 18X18 ~~LOC~~+RFID (SPONGE) ×1 IMPLANT
STRIP CLOSURE SKIN 1/2X4 (GAUZE/BANDAGES/DRESSINGS) IMPLANT
SUT MON AB 4-0 PC3 18 (SUTURE) ×1 IMPLANT
SUT PROLENE 2 0 SH DA (SUTURE) ×2 IMPLANT
SUT SILK 2 0 SH (SUTURE) IMPLANT
SUT SILK 3 0 SH 30 (SUTURE) IMPLANT
SUT SILK 3-0 18XBRD TIE BLK (SUTURE) ×1 IMPLANT
SUT VIC AB 0 CT1 27XBRD ANBCTR (SUTURE) IMPLANT
SUT VIC AB 2-0 SH 27XBRD (SUTURE) ×1 IMPLANT
SUT VIC AB 3-0 SH 27X BRD (SUTURE) ×1 IMPLANT
SUT VICRYL 0 SH 27 (SUTURE) IMPLANT
SYR BULB EAR ULCER 3OZ GRN STR (SYRINGE) ×1 IMPLANT
SYR CONTROL 10ML LL (SYRINGE) ×1 IMPLANT
TOWEL GREEN STERILE FF (TOWEL DISPOSABLE) ×2 IMPLANT

## 2024-01-06 NOTE — Progress Notes (Signed)
 Assisted Dr. Renold Don with left, transabdominal plane, ultrasound guided block. Side rails up, monitors on throughout procedure. See vital signs in flow sheet. Tolerated Procedure well.

## 2024-01-06 NOTE — Discharge Instructions (Signed)

## 2024-01-06 NOTE — Anesthesia Procedure Notes (Signed)
 Anesthesia Regional Block: TAP block   Pre-Anesthetic Checklist: , timeout performed,  Correct Patient, Correct Site, Correct Laterality,  Correct Procedure, Correct Position, site marked,  Risks and benefits discussed,  Surgical consent,  Pre-op evaluation,  At surgeon's request and post-op pain management  Laterality: Left  Prep: chloraprep       Needles:  Injection technique: Single-shot  Needle Type: Echogenic Stimulator Needle      Needle Gauge: 21     Additional Needles:   Procedures:,,,, ultrasound used (permanent image in chart),,    Narrative:  Start time: 01/06/2024 8:14 AM End time: 01/06/2024 8:34 AM Injection made incrementally with aspirations every 5 mL.  Performed by: Personally  Anesthesiologist: Darlyn Rush, MD  Additional Notes: BP cuff, SpO2 and EKG monitors applied. Sedation begun.  Anesthetic injected incrementally, slowly, and after neg aspirations under direct ultrasound guidance. Good fascial spread noted. Patient tolerated well.

## 2024-01-06 NOTE — Op Note (Signed)
 01/06/2024  10:11 AM  PATIENT:  Christopher Hunter  68 y.o. male  PRE-OPERATIVE DIAGNOSIS:  LEFT INGUINAL HERNIA  POST-OPERATIVE DIAGNOSIS:  LEFT INGUINAL HERNIA  PROCEDURE:  Procedure(s) with comments: LEFT INGUINAL HERNIA REPAIR WITH MESH (Left) - GEN/TAP BLOCK  SURGEON:  Surgeons and Role:    * Curvin Deward MOULD, MD - Primary  PHYSICIAN ASSISTANT:   ASSISTANTS: none   ANESTHESIA:   local and general  EBL:  35 mL   BLOOD ADMINISTERED:none  DRAINS: none   LOCAL MEDICATIONS USED:  MARCAINE      SPECIMEN:  No Specimen  DISPOSITION OF SPECIMEN:  N/A  COUNTS:  YES  TOURNIQUET:  * No tourniquets in log *  DICTATION: .Dragon Dictation  After informed consent was obtained the patient was brought to the operating room and placed in the supine position on the operating table.  After adequate induction of general anesthesia the patient's abdomen and left groin were prepped with ChloraPrep, allowed to dry, and draped in usual sterile manner.  An appropriate timeout was performed.  The left groin was then infiltrated with quarter percent Marcaine .  A small incision was made with a 15 blade knife from the edge of this pubic tubercle on the left towards the anterior superior iliac spine until the fascia of the external oblique was encountered.  A small bleeding vein was clamped with hemostats, divided, and ligated with 3-0 silk ties.  The external oblique fascia was opened along its fibers towards the apex of the external ring with a 15 blade knife and Metzenbaum scissors.  A [place] retractor was deployed.  Blunt dissection was carried out of the cord structures until they could be surrounded between 2 fingers.  I half-inch Penrose drain was placed around the cord structures for retraction purposes.  The cord was then gently skeletonized.  There was a moderate-sized lipoma of the cord that was gently separated and ligated near its base with a 3-0 silk stitch.  Care was taken to avoid any injury  to the testicular vessels and vas.  The patient had obvious bulge medial to the cord consistent with a direct hernia.  This was a wide based.  It was reduced without difficulty and the floor of the canal was repaired with multiple interrupted 0 Vicryl stitches.  Next a 3 x 6 piece of UltraPro mesh was chosen and cut at appropriate size.  The mesh was sewed inferiorly to the shelving edge of the inguinal ligament with a running 2-0 Prolene stitch.  Lateral to the cord tails were cut and the mesh and the tails were wrapped around the cord structures.  Medially and superiorly the mesh was sewed to the muscular aponeurotic strength layer of the transversalis with interrupted 2-0 Prolene vertical mattress stitches.  Lateral to the floor the tails of the mesh were anchored to the shelving edge of the inguinal ligament with interrupted 2-0 Prolene stitches.  Once this was accomplished the mesh appeared to be in good position and the hernia seem well repaired.  During the dissection the ilioinguinal nerve was identified and involved with some scar tissue.  It was dissected out proximally distally, clamped, divided, and ligated with 3-0 silk ties.  The wound was then irrigated with copious amounts of saline.  The external oblique fascia was reapproximated with a running 2-0 Vicryl stitches.  The wound was infiltrated with quarter percent Marcaine .  The subcutaneous fascia was closed with a running 3-0 Vicryl stitch.  The skin was closed with a  running 4-0 Monocryl subcuticular stitch.  Dermabond belongings were applied.  The patient tolerated the procedure well.  At the end of the case all needle sponge and instrument counts were correct.  The patient was then awakened and taken recovery in stable condition.  The patient's testicle was in his scrotum at the end of the case.  PLAN OF CARE: Discharge to home after PACU  PATIENT DISPOSITION:  PACU - hemodynamically stable.   Delay start of Pharmacological VTE agent  (>24hrs) due to surgical blood loss or risk of bleeding: not applicable

## 2024-01-06 NOTE — H&P (Signed)
 REFERRING PHYSICIAN: Candise Aleene DEL, MD PROVIDER: DEWARD GARNETTE NULL, MD MRN: [id number] DOB: 07-09-1955 Subjective   Chief Complaint: New Consultation ( non recurrent unlateral ing hernia w/o obst or gang)  History of Present Illness: Christopher Hunter is a 68 y.o. male who is seen today as an office consultation for evaluation of New Consultation ( non recurrent unlateral ing hernia w/o obst or gang)  We are asked to see the patient in consultation by Dr. Aleene Cornwall to evaluate him for a left inguinal hernia. The patient is a 68 year old white male who has had a known left inguinal hernia for probably 5 or 6 years. He occasionally has some mild discomfort associated with it. He denies any nausea or vomiting. His appetite is good and his bowels are working normally. He is otherwise in good health and does not smoke. He had a right inguinal hernia performed by Dr. Eletha many years ago  Review of Systems: A complete review of systems was obtained from the patient. I have reviewed this information and discussed as appropriate with the patient. See HPI as well for other ROS.  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  Right inguinal hernia 2019   Patient Active Problem List  Diagnosis  Left inguinal hernia   Past Surgical History:  Procedure Laterality Date  Lumbar disc surgery 1994  LITHOTRIPSY 2009  Cardiovascular stress test 07/2013  Extracorporeal shock wave lithotripsy Left 09/15/2020  INGUINAL HERNIA REPAIR Left    No Known Allergies  Current Outpatient Medications on File Prior to Visit  Medication Sig Dispense Refill  amLODIPine  (NORVASC ) 10 MG tablet Take 10 mg by mouth once daily  aspirin  81 MG EC tablet Take 81 mg by  mouth once daily  HYDROcodone -acetaminophen  (NORCO) 5-325 mg tablet Take 1-2 tablets by mouth every 6 (six) hours as needed  ibuprofen  (MOTRIN ) 800 MG tablet Take 800 mg by mouth every 8 (eight) hours as needed  irbesartan  (AVAPRO ) 300 MG tablet Take 300 mg by mouth once daily  metoprolol  SUCCinate (TOPROL -XL) 50 MG XL tablet Take 50 mg by mouth once daily  multivitamin tablet Take 1 tablet by mouth once daily  tamsulosin  (FLOMAX ) 0.4 mg capsule Take 0.4 mg by mouth once daily   No current facility-administered medications on file prior to visit.   Family History  Family history unknown: Yes    Social History   Tobacco Use  Smoking Status Never  Smokeless Tobacco Current  Types: Snuff    Social History   Socioeconomic History  Marital status: Married  Tobacco Use  Smoking status: Never  Smokeless tobacco: Current  Types: Snuff  Vaping Use  Vaping status: Never Used  Substance and Sexual Activity  Alcohol use: Never  Drug use: Never  Sexual activity: Defer   Social Drivers of Health   Financial Resource Strain: Low Risk (10/26/2023)  Received from [facility]  Overall Financial Resource Strain (CARDIA)  How hard is it for you to pay for the very basics like food, housing, medical care, and heating?: Not hard at all  Food Insecurity: No Food Insecurity (10/26/2023)  Received from [facility]  Hunger Vital Sign  Within the past 12 months, you worried that your food would run out before you got the money to buy  more.: Never true  Within the past 12 months, the food you bought just didn't last and you didn't have money to get more.: Never true  Transportation Needs: No Transportation Needs (10/26/2023)  Received from [facility] - Transportation  In the past 12 months, has lack of transportation kept you from medical appointments or from getting medications?: No  In the past 12 months, has lack of transportation kept you from meetings, work, or from getting things  needed for daily living?: No  Physical Activity: Inactive (10/26/2023)  Received from [facility]  Exercise Vital Sign  On average, how many days per week do you engage in moderate to strenuous exercise (like a brisk walk)?: 0 days  On average, how many minutes do you engage in exercise at this level?: 0 min  Stress: No Stress Concern Present (10/26/2023)  Received from [facility] - Occupational Stress Questionnaire  Do you feel stress - tense, restless, nervous, or anxious, or unable to sleep at night because your mind is troubled all the time - these days?: Not at all  Social Connections: Socially Integrated (10/26/2023)  Received from [facility]  Social Connection and Isolation Panel  In a typical week, how many times do you talk on the phone with family, friends, or neighbors?: More than three times a week  How often do you get together with friends or relatives?: More than three times a week  How often do you attend church or religious services?: More than 4 times per year  Do you belong to any clubs or organizations such as church groups, unions, fraternal or athletic groups, or school groups?: Yes  How often do you attend meetings of the clubs or organizations you belong to?: More than 4 times per year  Are you married, widowed, divorced, separated, never married, or living with a partner?: Married  Housing Stability: Unknown (12/15/2023)  Housing Stability Vital Sign  Homeless in the Last Year: No   Objective:   Vitals:  BP: 126/82  Pulse: 69  Temp: 36.8 C (98.2 F)  TempSrc: Temporal  SpO2: 99%  Weight: 93 kg (205 lb)  Height: 179.1 cm (5' 10.5)  PainSc: 0-No pain   Body mass index is 29 kg/m.  Physical Exam Constitutional:  General: He is not in acute distress. Appearance: Normal appearance.  HENT:  Head: Normocephalic and atraumatic.  Right Ear: External ear normal.  Left Ear: External ear normal.  Nose: Nose normal.   Mouth/Throat:  Mouth: Mucous membranes are moist.  Pharynx: Oropharynx is clear.  Eyes:  General: No scleral icterus. Extraocular Movements: Extraocular movements intact.  Conjunctiva/sclera: Conjunctivae normal.  Pupils: Pupils are equal, round, and reactive to light.  Cardiovascular:  Rate and Rhythm: Normal rate and regular rhythm.  Pulses: Normal pulses.  Heart sounds: Normal heart sounds.  Pulmonary:  Effort: Pulmonary effort is normal. No respiratory distress.  Breath sounds: Normal breath sounds.  Abdominal:  General: Abdomen is flat. Bowel sounds are normal. There is no distension.  Palpations: Abdomen is soft.  Tenderness: There is no abdominal tenderness.  Genitourinary: Comments: There is a moderate size bulge that reduces easily in the left groin. There is no palpable bulge or impulse with straining in the right groin Musculoskeletal:  General: No swelling or deformity. Normal range of motion.  Cervical back: Normal range of motion and neck supple. No tenderness.  Skin: General: Skin is warm and dry.  Coloration: Skin is not jaundiced.  Neurological:  General: No focal deficit present.  Mental Status: He is alert and oriented to person, place, and time.  Psychiatric:  Mood and Affect: Mood normal.  Behavior: Behavior normal.     Labs, Imaging and Diagnostic Testing:  Assessment and Plan:   Diagnoses and all orders for this visit:  Left inguinal hernia - [facility] Case Posting Request; Future   The patient appears to have a moderate sized symptomatic left inguinal hernia. Because of the risk of incarceration and strangulation I feel he would benefit from having this fixed. He would also like to have this done. I have discussed with him in detail the risks and benefits of the operation as well as some of the technical aspects including the use of mesh and the risk of chronic pain and he understands and wishes to proceed. We will move forward with surgical  scheduling.

## 2024-01-06 NOTE — Transfer of Care (Signed)
 Immediate Anesthesia Transfer of Care Note  Patient: Christopher Hunter  Procedure(s) Performed: REPAIR, HERNIA, INGUINAL, ADULT WITH MESH (Left: Inguinal)  Patient Location: PACU  Anesthesia Type:General and Regional  Level of Consciousness: awake, alert , and patient cooperative  Airway & Oxygen Therapy: Patient Spontanous Breathing and Patient connected to face mask oxygen  Post-op Assessment: Report given to RN and Post -op Vital signs reviewed and stable  Post vital signs: Reviewed and stable  Last Vitals:  Vitals Value Taken Time  BP 132/75 01/06/24 10:19  Temp    Pulse 79 01/06/24 10:22  Resp 15 01/06/24 10:22  SpO2 95 % 01/06/24 10:22  Vitals shown include unfiled device data.  Last Pain:  Vitals:   01/06/24 0753  TempSrc: Tympanic  PainSc: 0-No pain      Patients Stated Pain Goal: 7 (01/06/24 0753)  Complications: No notable events documented.

## 2024-01-06 NOTE — Interval H&P Note (Signed)
 History and Physical Interval Note:  01/06/2024 7:52 AM  Christopher Hunter  has presented today for surgery, with the diagnosis of LEFT INGUINAL HERNIA.  The various methods of treatment have been discussed with the patient and family. After consideration of risks, benefits and other options for treatment, the patient has consented to  Procedure(s) with comments: REPAIR, HERNIA, INGUINAL, ADULT (Left) - GEN/TAP BLOCK as a surgical intervention.  The patient's history has been reviewed, patient examined, no change in status, stable for surgery.  I have reviewed the patient's chart and labs.  Questions were answered to the patient's satisfaction.     Deward Null III

## 2024-01-06 NOTE — Anesthesia Procedure Notes (Signed)
 Procedure Name: Intubation Date/Time: 01/06/2024 8:58 AM  Performed by: Burnard Rosaline HERO, CRNAPre-anesthesia Checklist: Patient identified, Emergency Drugs available, Suction available and Patient being monitored Patient Re-evaluated:Patient Re-evaluated prior to induction Oxygen Delivery Method: Circle system utilized Preoxygenation: Pre-oxygenation with 100% oxygen Induction Type: IV induction Ventilation: Mask ventilation without difficulty Laryngoscope Size: Mac and 4 Grade View: Grade I Tube type: Oral Tube size: 7.5 mm Number of attempts: 1 Airway Equipment and Method: Stylet and Oral airway Placement Confirmation: ETT inserted through vocal cords under direct vision, positive ETCO2, breath sounds checked- equal and bilateral and CO2 detector Secured at: 23 cm Tube secured with: Tape Dental Injury: Teeth and Oropharynx as per pre-operative assessment

## 2024-01-09 ENCOUNTER — Encounter (HOSPITAL_BASED_OUTPATIENT_CLINIC_OR_DEPARTMENT_OTHER): Payer: Self-pay | Admitting: General Surgery

## 2024-01-09 NOTE — Anesthesia Postprocedure Evaluation (Signed)
 Anesthesia Post Note  Patient: Christopher Hunter  Procedure(s) Performed: REPAIR, HERNIA, INGUINAL, ADULT WITH MESH (Left: Inguinal)     Patient location during evaluation: PACU Anesthesia Type: General Level of consciousness: sedated and patient cooperative Pain management: pain level controlled Vital Signs Assessment: post-procedure vital signs reviewed and stable Respiratory status: spontaneous breathing Cardiovascular status: stable Anesthetic complications: no   No notable events documented.  Last Vitals:  Vitals:   01/06/24 1030 01/06/24 1046  BP:  123/65  Pulse: 77 64  Resp: 14 14  Temp:  (!) 36.3 C  SpO2: 91% 94%    Last Pain:  Vitals:   01/06/24 0753  TempSrc: Tympanic                 Norleen Pope

## 2024-02-16 ENCOUNTER — Other Ambulatory Visit: Payer: Self-pay | Admitting: Family Medicine

## 2024-03-02 NOTE — Progress Notes (Signed)
 Christopher Hunter                                          MRN: [id number]   03/02/2024   The [facility] Quality Team Specialist reviewed this patient medical record for the purposes of chart review for care gap closure. The following were reviewed: abstraction for care gap closure-colorectal cancer screening.    [facility] Quality Team

## 2024-04-18 DIAGNOSIS — N281 Cyst of kidney, acquired: Secondary | ICD-10-CM | POA: Diagnosis not present

## 2024-04-18 DIAGNOSIS — N200 Calculus of kidney: Secondary | ICD-10-CM | POA: Diagnosis not present

## 2024-06-11 ENCOUNTER — Ambulatory Visit: Admitting: Family Medicine
